# Patient Record
Sex: Female | Born: 1970
Health system: Southern US, Community
[De-identification: ages and names within clinical notes are randomized; demographics above are authoritative.]

## PROBLEM LIST (undated history)

## (undated) DIAGNOSIS — E7800 Pure hypercholesterolemia, unspecified: Secondary | ICD-10-CM

## (undated) DIAGNOSIS — M1990 Unspecified osteoarthritis, unspecified site: Secondary | ICD-10-CM

## (undated) DIAGNOSIS — I10 Essential (primary) hypertension: Secondary | ICD-10-CM

## (undated) HISTORY — PX: MENISCUS REPAIR: SHX5179

## (undated) HISTORY — PX: WISDOM TOOTH EXTRACTION: SHX21

## (undated) HISTORY — DX: Essential (primary) hypertension: I10

## (undated) HISTORY — PX: REFRACTIVE SURGERY: SHX103

## (undated) HISTORY — DX: Unspecified osteoarthritis, unspecified site: M19.90

## (undated) HISTORY — DX: Pure hypercholesterolemia, unspecified: E78.00

## (undated) HISTORY — PX: ENDOMETRIAL ABLATION: SHX621

---

## 1999-10-14 ENCOUNTER — Other Ambulatory Visit: Admission: RE | Admit: 1999-10-14 | Discharge: 1999-10-14 | Payer: Self-pay | Admitting: Obstetrics & Gynecology

## 2000-09-05 ENCOUNTER — Other Ambulatory Visit: Admission: RE | Admit: 2000-09-05 | Discharge: 2000-09-05 | Payer: Self-pay | Admitting: Obstetrics and Gynecology

## 2000-11-26 ENCOUNTER — Other Ambulatory Visit: Admission: RE | Admit: 2000-11-26 | Discharge: 2000-11-26 | Payer: Self-pay | Admitting: Obstetrics & Gynecology

## 2001-03-29 ENCOUNTER — Inpatient Hospital Stay (HOSPITAL_COMMUNITY): Admission: AD | Admit: 2001-03-29 | Discharge: 2001-03-29 | Payer: Self-pay | Admitting: Obstetrics and Gynecology

## 2001-03-31 ENCOUNTER — Inpatient Hospital Stay (HOSPITAL_COMMUNITY): Admission: AD | Admit: 2001-03-31 | Discharge: 2001-04-02 | Payer: Self-pay | Admitting: Obstetrics and Gynecology

## 2001-04-26 ENCOUNTER — Other Ambulatory Visit: Admission: RE | Admit: 2001-04-26 | Discharge: 2001-04-26 | Payer: Self-pay | Admitting: Obstetrics & Gynecology

## 2002-05-14 ENCOUNTER — Encounter (INDEPENDENT_AMBULATORY_CARE_PROVIDER_SITE_OTHER): Payer: Self-pay | Admitting: Specialist

## 2002-05-14 ENCOUNTER — Inpatient Hospital Stay (HOSPITAL_COMMUNITY): Admission: AD | Admit: 2002-05-14 | Discharge: 2002-05-17 | Payer: Self-pay | Admitting: Obstetrics & Gynecology

## 2002-06-13 ENCOUNTER — Other Ambulatory Visit: Admission: RE | Admit: 2002-06-13 | Discharge: 2002-06-13 | Payer: Self-pay | Admitting: Obstetrics & Gynecology

## 2003-11-08 ENCOUNTER — Emergency Department (HOSPITAL_COMMUNITY): Admission: EM | Admit: 2003-11-08 | Discharge: 2003-11-08 | Payer: Self-pay | Admitting: Emergency Medicine

## 2004-01-04 ENCOUNTER — Other Ambulatory Visit: Admission: RE | Admit: 2004-01-04 | Discharge: 2004-01-04 | Payer: Self-pay | Admitting: Obstetrics & Gynecology

## 2004-01-11 ENCOUNTER — Encounter: Admission: RE | Admit: 2004-01-11 | Discharge: 2004-01-11 | Payer: Self-pay | Admitting: Obstetrics & Gynecology

## 2005-03-10 ENCOUNTER — Other Ambulatory Visit: Admission: RE | Admit: 2005-03-10 | Discharge: 2005-03-10 | Payer: Self-pay | Admitting: Family Medicine

## 2007-08-01 ENCOUNTER — Emergency Department (HOSPITAL_COMMUNITY): Admission: EM | Admit: 2007-08-01 | Discharge: 2007-08-01 | Payer: Self-pay | Admitting: Emergency Medicine

## 2008-08-16 ENCOUNTER — Emergency Department (HOSPITAL_COMMUNITY): Admission: EM | Admit: 2008-08-16 | Discharge: 2008-08-16 | Payer: Self-pay | Admitting: Emergency Medicine

## 2010-04-27 ENCOUNTER — Other Ambulatory Visit: Admission: RE | Admit: 2010-04-27 | Discharge: 2010-04-27 | Payer: Self-pay | Admitting: Family Medicine

## 2011-02-03 NOTE — Discharge Summary (Signed)
NAMEPAYTYN, Mariah Irwin                          ACCOUNT NO.:  [id number]   MEDICAL RECORD NO.:  [id number]                   PATIENT TYPE:  INP   LOCATION:  9108                                 F[facility]ILITY:  [facility]   PHYSICIAN:  Genia Del, M.D.             DATE OF BIRTH:  [birth year]   DATE OF ADMISSION:  05/14/2002  DATE OF DISCHARGE:  05/17/2002                                 DISCHARGE SUMMARY   ADMISSION DIAGNOSES:  1. A 40 week and 1 day gestation with advanced cervical dilatation.  2. Suspicion of large for gestational age.   DISCHARGE DIAGNOSES:  1. A 40 week and 1 day gestation with advanced cervical dilatation.  2. Suspicion of large for gestational age.  3. Nonreassuring fetal heart rate monitoring.  4. Simple right ovarian cyst during pregnancy.  5. Birth of a baby girl 8 pounds and 4 ounces.   SURGERY:  Primary urgent low transverse cesarean section and right ovarian  cystectomy on May 14, 2002.   HOSPITAL COURSE:  The patient is a G2, P1 at 40 weeks and 1 day gestation  admitted for induction.  She was started on low dose Pitocin.  Artificial  rupture of membranes was done at 8:15 a.m.  The amniotic fluid was clear.  She progressed well up to 6 cm and then had protracted labor as well as a  fetal heart rate that tended to be bradycardic with a baseline in the 100-  105.  There were also episodes of further bradycardia down to 60-80  recovering with scalp stimulation.  She had an O2 mask and changed position  many times.  An amnioinfusion was attempted without much improvement.  There  were intermittent accelerations to 120-130.  Pitocin was discontinued during  periods of bradycardia and uterine contractions decreased to every three to  four minutes.  Her examination at that time around 1 p.m. was at 7 cm with a  left occiput posterior presentation at -1.  Because of her nonreassuring  fetal heart rate monitoring the decision was taken to proceed with stat  cesarean section.  A primary urgent low transverse cesarean section was  performed.  A right ovarian cystectomy was done which was a simple clear  cyst measuring about 6 cm.  Birth of a baby girl at 1325 weighing 8 pounds 4  ounces in posterior presentation.  Apgars were 8 and 9 by the neonatal team.  pH was 7.26.  The hysteroscopy was closed in two planes.  Estimated blood  loss was 700 cc.  No complication occurred.  The postoperative evolution was  unremarkable.  The patient remained afebrile and hemodynamically stable.  Her hemoglobin on postoperative day number one was 9 with a hematocrit at  26.8.  The patient was discharged in good status on postoperative day number  three.  She was prescribed Darvocet p.r.n.  Will follow up at the office in  about  four weeks.  Postoperative postpartum advice given.                                               Genia Del, M.D.    ML/MEDQ  D:  06/23/2002  T:  06/23/2002  Job:  [id number]

## 2011-02-03 NOTE — H&P (Signed)
NAME:  Mariah Irwin, Mariah Irwin                          ACCOUNT NO.:  [id number]   MEDICAL RECORD NO.:  [id number]                   PATIENT TYPE:  INP   LOCATION:  9167                                 [facility]ILITY:  [facility]   PHYSICIAN:  Genia Del, M.D.             DATE OF BIRTH:  22-Jan-1971   DATE OF ADMISSION:  05/14/2002  DATE OF DISCHARGE:                                HISTORY & PHYSICAL   HISTORY AND PHYSICAL:  The patient is a 40 year old woman, G2, P1, expected  date of delivery May 13, 2002, by ultrasound at 40 weeks and 1 day  gestation.   REASON FOR ADMISSION:  Induction for favorable cervix in a multiparous  patient.   HISTORY OF PRESENT ILLNESS:  Fetal movements positive.  No vaginal bleeding.  No fluid leak.  No regular uterine contractions.  No PIH symptoms.   PAST OBSTETRICAL HISTORY:  July 2002, 40 weeks, vacuum-assisted vaginal  delivery, baby boy, 7 pounds 8 ounces.  No complication.   PAST MEDICAL HISTORY:  Negative.   PAST SURGICAL HISTORY:  1. Extraction of wisdom teeth at age 40.  2. Root canal at age 19.  3. LASIK surgery, March 2001.   FAMILY HISTORY:  Noncontributory.   SOCIAL HISTORY:  Married, vegetarian, nonsmoker.   ALLERGIES:  No known drug allergies.   MEDICATIONS:  1. Prenatal vitamins.  2. Iron supplement.   HISTORY OF PRESENT PREGNANCY:  Mild spotting in the first trimester.  Labs  showed a hemoglobin of 12.9, platelets 343, blood type Rh O positive, Rh  antibodies negative, syphilis nonreactive,  HbsAg negative, rubella titer  immune, gonorrhea and Chlamydia negative.  In the second trimester, she had  a triple test which showed within normal limits.  Ultrasound at 19+ weeks  showed a review of anatomy within normal limits.  Placenta was posterior  normal.  Dating was achieved by ultrasound in the first trimester and was  compatible with 19+ weeks ultrasound.  She had stable right ovarian clear  cyst at 5.9 cm.  In the third trimester,  one hour GTT was normal at 116.  Hemoglobin was 11 at 27+ weeks.  At 32 weeks, the patient had mild preterm  cervical change which remained stable with relative bed rest.  Group B strep  was negative at 33+ weeks.  Blood pressures remained normal throughout  pregnancy.   REVIEW OF SYSTEMS:  CONSTITUTIONAL:  Negative.  HEENT:  Negative.  CARDIOVASCULAR/RESPIRATORY/GI/UROLOGY:  Negative.  DERMATOLOGY/ENDOCRINE/NEUROLOGIC:  Negative.   PHYSICAL EXAMINATION:  GENERAL:  No apparent distress on admission.  VITAL SIGNS:  Temperature 97.6, pulse 68, respiratory rate 20, blood  pressure 118/64.  LUNGS:  Clear bilaterally.  HEART:  Regular cardiac rhythm.  No murmur.  ABDOMEN:  Gravid.  Cephalad presentation.  VAGINAL EXAM ON ADMISSION:  3 cm dilated, 75-80% effaced, vertex, -1.  Membranes intact. Baseline was 150's.  LOWER LIMBS:  Normal.  MONITORING:  Fetal heart rate baseline 125-135.  Good long-term variability  with accelerations, reactive NST, no deceleration.  Uterine contractions  irregular on admission.   IMPRESSION:  1. G2, P1, 40 weeks and 1 day gestation with favorable cervix.  2. Fetal well being reassuring.  3. Group B strep negative.   PLAN:  1. Induce labor with low-dose Pitocin and artificial rupture of membranes.  2. Monitoring.  3. Expectant management towards probable vaginal delivery.                                               Genia Del, M.D.    ML/MEDQ  D:  05/14/2002  T:  05/14/2002  Job:  [id number]

## 2011-02-03 NOTE — H&P (Signed)
[facility]  Patient:    Mariah Irwin, Mariah Irwin                       MRN: [id number] Adm. Date:  [id number] Attending:  Silverio Lay A                         History and Physical  REASON FOR ADMISSION:         Intrauterine pregnancy at 39 weeks and 4 days with spontaneous rupture of membranes.  HISTORY OF PRESENT ILLNESS:   This is a 40 year old, married, Asian female, gravida 1, para 0, with a due date of April 03, 2001, who came in reporting continuous rupture of membranes at 2:45 a.m.  The fluid was meconium stained and she was reporting contractions every 5-10 minutes.  She reports good fetal activity.  Denies any bleeding and denies any pregnancy-induced hypertension.  PRENATAL COURSE:              Blood O+.  Sickle cell trait negative.  RPR nonreactive.  Rubella immune.  HBSAG negative.  HIV nonreactive.  Pap smear in December of 2001 with ascus.  Gonorrhea and chlamydia in December negative.  A 16-week AFP done with prior practice within normal limits.  A 24-week complete anatomy redo by ultrasound within normal limits.  A 28-week glucose tolerance test within normal limits.  A 35-week group B streptococcus positive.  The prenatal course was otherwise complicated by PUPPP at 35-36 weeks of pregnancy with continuous resolution.  ALLERGIES:                    No known drug allergies.  PAST MEDICAL HISTORY:         History of ascus since December of 2001.  First trimester spotting secondary to subchorionic hemorrhage.  The patient is a lactovegetarian.  FAMILY HISTORY:               Maternal uncle with diabetes.  SOCIAL HISTORY:               Married.  Nonsmoker.  She is a professor at [employer].  PHYSICAL EXAMINATION:         The vital signs are normal.  HEENT:                        Negative.  LUNGS:                        Clear.  HEART:                        Normal.  ABDOMEN:                      Gravid and nontender.  PELVIC:                        The vaginal exam by the admitting nurse showed 3 cm, 90% effaced, and vertex -1.  Light meconium-stained fluid.  Nitrazine positive.  Fern positive.  EXTREMITIES:                  Negative.  ASSESSMENT:                   1. Intrauterine pregnancy at 39 weeks and  4 days.                               2. Spontaneous rupture of membranes with light                                  meconium-stained fluid.                               3. Group B streptococcus positive.                               4. Early labor.  PLAN:                         The patient will be admitted to labor and delivery with routine peripartum orders and will be started on penicillin IV protocol for group B streptococcus positive.  If labor does not achieve an active pattern, will start Pitocin p.r.n. DD:  03/31/01 TD:  03/31/01 Job: [id number]

## 2011-02-03 NOTE — Op Note (Signed)
Mariah Irwin, Mariah Irwin                          ACCOUNT NO.:  [id number]   MEDICAL RECORD NO.:  [id number]                   PATIENT TYPE:  INP   LOCATION:  9108                                 [facility]ILITY[facility]   PHYSICIAN:  Genia Del, M.D.             DATE OF BIRTH:  1971/08/25   DATE OF PROCEDURE:  05/14/2002  DATE OF DISCHARGE:                                 OPERATIVE REPORT   PREOPERATIVE DIAGNOSES:  1. Forty plus weeks gestation.  2. Non-reassuring fetal heart rate monitoring.  3. Simple right ovarian cyst on ultrasound during pregnancy.   POSTOPERATIVE DIAGNOSES:  1. Forty plus weeks gestation.  2. Non-reassuring fetal heart rate monitoring.  3. Simple right ovarian cyst on ultrasound during pregnancy.   INTERVENTION:  Primary urgent low transverse cesarean section plus right  ovarian cystectomy.   SURGEON:  Genia Del, M.D.   ASSISTANT:  Sheronette A. Cherly Hensen, M.D.   ANESTHESIOLOGIST:  Quillian Quince, M.D.   PROCEDURE:  Under epidural, the patient is in a 15-degree left decubitus  position.  She is prepped with a spray of Betadine over the abdomen,  suprapubic, and vulvar areas.  The bladder catheter is already in place and  the patient is draped as usual.  A Pfannenstiel incision is made with a  scalpel.  The aponeurosis is opened transversally with the Mayo scissors  then separated from the recti muscles on the midline.  We opened the  parietal peritoneum bluntly and stretched on each side with the fingers.  We  then inserted the bladder retractor.  The visceral peritoneum is opened  transversely with the Metzenbaum scissors over the lower uterine segment.  The bladder is reclined downward.  We then opened the lower uterine segment  transversely with a scalpel.  Prolongation is made on each side with the  scissors.  The amniotic fluid is clear.  The baby is in left posterior  occiput presentation.  Birth of a baby girl at 35.  The baby is  suctioned  after delivery of the head.  The cord is then clamped and cut.  The baby is  given to the neonatal team.  Apgars are 8 and 9, pH is 7.26.  Cord blood is  also taken.  We evacuated the placenta spontaneously.  It is sent to  pathology.  Revision of the uterus is done.  We start Pitocin in the IV  fluids.  A dose of Cefotan 1 g IV is given.  The hysterotomy is closed in a  first total plane with a running-lock of 0 Vicryl.  A second plane is done  in a mattress fashion with 0 Vicryl.  That completes hemostasis.  We then  visualized the right ovarian cyst.  It appears like a thin-walled tier cyst.  A cystectomy is done by fine dissection with Metzenbaum scissors.  The wall  is sent to pathology.  Hemostasis is controlled at  the base of the right  ovary with a pursestring stitch with 3-0 Vicryl.  We completed the closure  of the right ovary with a mattress stitch to finish with 3-0 Vicryl.  Hemostasis is good at that level.  Both tubes are normal in size and  appearance.  The left ovary is normal in size and appearance.  The uterus is  contracting well and normal.  We then irrigated and suctioned the  abdominopelvic cavity.  Blood clots are removed.  We then verified  hemostasis at the hysterotomy and it was adequate.  We verified hemostasis  at the level of the bladder flap, the recti muscles, and the aponeurosis,  and it is completed as necessary with the electrocautery.  We reapproximated  the recti muscles in the midline to avoid a diastasis of the recti muscles.  We do that with 2-0 Vicryl.  Two blue sutures are put in place.  We then  closed the aponeurosis with two half-running sutures of 0 Vicryl and  verified hemostasis on the adipose tissue.  This completed with the  electrocautery.  The subcutaneous tissue infiltrated with Marcaine 0.25%  plain 10 cc and we approximated  the skin with staples.  The count of  sponges and instruments was complete x2.  The estimated blood  loss was 700  cc.  The patient's blood group is O-positive.  She was rubella immune.  A  dry dressing was put on the incision.  The patient was transferred to  recovery room in good status.  No complication occurred.                                                Genia Del, M.D.    ML/MEDQ  D:  05/14/2002  T:  05/14/2002  Job:  [id number]

## 2011-06-08 ENCOUNTER — Other Ambulatory Visit: Payer: Self-pay | Admitting: Physician Assistant

## 2011-06-08 ENCOUNTER — Other Ambulatory Visit (HOSPITAL_COMMUNITY)
Admission: RE | Admit: 2011-06-08 | Discharge: 2011-06-08 | Disposition: A | Discharge status: Home / Self Care | Payer: Self-pay | Source: Ambulatory Visit | Attending: Family Medicine | Admitting: Family Medicine

## 2011-06-08 DIAGNOSIS — Z124 Encounter for screening for malignant neoplasm of cervix: Secondary | ICD-10-CM | POA: Insufficient documentation

## 2012-08-08 ENCOUNTER — Other Ambulatory Visit (HOSPITAL_COMMUNITY)
Admission: RE | Admit: 2012-08-08 | Discharge: 2012-08-08 | Disposition: A | Discharge status: Home / Self Care | Payer: 59 | Source: Ambulatory Visit | Attending: Family Medicine | Admitting: Family Medicine

## 2012-08-08 ENCOUNTER — Other Ambulatory Visit: Payer: Self-pay | Admitting: Physician Assistant

## 2012-08-08 DIAGNOSIS — Z124 Encounter for screening for malignant neoplasm of cervix: Secondary | ICD-10-CM | POA: Insufficient documentation

## 2012-10-03 ENCOUNTER — Other Ambulatory Visit (HOSPITAL_COMMUNITY): Payer: Self-pay | Admitting: Physician Assistant

## 2012-10-03 ENCOUNTER — Other Ambulatory Visit: Payer: Self-pay | Admitting: Family Medicine

## 2012-10-03 DIAGNOSIS — Z1231 Encounter for screening mammogram for malignant neoplasm of breast: Secondary | ICD-10-CM

## 2012-11-01 ENCOUNTER — Ambulatory Visit: Payer: 59

## 2012-11-29 ENCOUNTER — Ambulatory Visit: Payer: 59

## 2012-12-23 ENCOUNTER — Other Ambulatory Visit: Payer: Self-pay | Admitting: Internal Medicine

## 2012-12-23 DIAGNOSIS — I10 Essential (primary) hypertension: Secondary | ICD-10-CM

## 2012-12-27 ENCOUNTER — Ambulatory Visit
Admission: RE | Admit: 2012-12-27 | Discharge: 2012-12-27 | Disposition: A | Discharge status: Home / Self Care | Payer: 59 | Source: Ambulatory Visit | Attending: Family Medicine | Admitting: Family Medicine

## 2012-12-27 DIAGNOSIS — Z1231 Encounter for screening mammogram for malignant neoplasm of breast: Secondary | ICD-10-CM

## 2012-12-30 ENCOUNTER — Ambulatory Visit
Admission: RE | Admit: 2012-12-30 | Discharge: 2012-12-30 | Disposition: A | Discharge status: Home / Self Care | Payer: 59 | Source: Ambulatory Visit | Attending: Internal Medicine | Admitting: Internal Medicine

## 2012-12-30 DIAGNOSIS — I10 Essential (primary) hypertension: Secondary | ICD-10-CM

## 2013-07-11 ENCOUNTER — Other Ambulatory Visit: Payer: Self-pay | Admitting: Obstetrics and Gynecology

## 2013-07-15 ENCOUNTER — Other Ambulatory Visit: Payer: Self-pay | Admitting: General Surgery

## 2013-07-15 MED ORDER — SIMVASTATIN 10 MG PO TABS: 10.0000 mg | ORAL_TABLET | Freq: Every day | ORAL | Status: DC

## 2013-07-18 ENCOUNTER — Telehealth: Payer: Self-pay

## 2013-07-18 NOTE — Telephone Encounter (Signed)
refill 

## 2013-08-08 ENCOUNTER — Telehealth: Payer: Self-pay | Admitting: Cardiology

## 2013-08-08 NOTE — Telephone Encounter (Signed)
To Dr Turner to advise 

## 2013-08-08 NOTE — Telephone Encounter (Signed)
That needs to be filled out by her PCP

## 2013-08-08 NOTE — Telephone Encounter (Signed)
Pt is aware.  

## 2013-08-08 NOTE — Telephone Encounter (Signed)
New Problem:   Pt is asking if all her tests/appts this year would be the equivalent of a physical. Pt is asking if  Dr. Mayford Knife can complete a form for her insurance. A Routine Preventative Care Form. Please call pt back

## 2014-05-15 ENCOUNTER — Encounter: Payer: Self-pay | Admitting: General Surgery

## 2014-05-15 DIAGNOSIS — I10 Essential (primary) hypertension: Secondary | ICD-10-CM

## 2014-06-04 ENCOUNTER — Encounter: Payer: Self-pay | Admitting: Cardiology

## 2014-06-04 ENCOUNTER — Ambulatory Visit (INDEPENDENT_AMBULATORY_CARE_PROVIDER_SITE_OTHER): Payer: 59 | Admitting: Cardiology

## 2014-06-04 VITALS — BP 122/78 | HR 58 | Ht 65.5 in | Wt 155.8 lb

## 2014-06-04 DIAGNOSIS — E785 Hyperlipidemia, unspecified: Secondary | ICD-10-CM | POA: Insufficient documentation

## 2014-06-04 DIAGNOSIS — I10 Essential (primary) hypertension: Secondary | ICD-10-CM

## 2014-06-04 MED ORDER — SIMVASTATIN 10 MG PO TABS: 10.0000 mg | ORAL_TABLET | Freq: Every day | ORAL | Status: DC

## 2014-06-04 MED ORDER — HYDROCHLOROTHIAZIDE 25 MG PO TABS: 25.0000 mg | ORAL_TABLET | Freq: Every day | ORAL | Status: DC

## 2014-06-04 NOTE — Progress Notes (Signed)
  [place], [address] [place], [place]  [address] Phone: [phone] Fax:  [phone]  Date:  06/04/2014   ID:  Doran Heater, DOB [birth year], MRN [id number]  PCP:  REDMON,NOELLE, PA-C  Cardiologist:  Sherre Scarlet   History of Present Illness: Mariah Irwin is a 43 y.o. female with a history of HTN and dyslipidemia  who presents today for followup.  She is doing well.  She denies any chest pain, SOB, DOE, LE edema, dizziness, palpitations or syncope.  Wt Readings from Last 3 Encounters:  06/04/14 155 lb 12.8 oz (70.67 kg)     Past Medical History  Diagnosis Date  . Hypertension   . Hypercholesteremia     Current Outpatient Prescriptions  Medication Sig Dispense Refill  . hydrochlorothiazide (HYDRODIURIL) 25 MG tablet Take 25 mg by mouth daily.      . Lactobacillus (ACIDOPHILUS PO) Take 1 tablet by mouth daily.      . Magnesium 250 MG TABS Take 1 tablet by mouth daily.      . Multiple Vitamin (MULTIVITAMIN) tablet Take 1 tablet by mouth daily.      . Omega-3 Fatty Acids (FISH OIL) 1200 MG CAPS Take 2 capsules by mouth daily.      . simvastatin (ZOCOR) 10 MG tablet Take 1 tablet (10 mg total) by mouth at bedtime.  90 tablet  3  . vitamin E 400 UNIT capsule Take 400 Units by mouth daily.       No current facility-administered medications for this visit.    Allergies:    Allergies  Allergen Reactions  . Lisinopril Other (See Comments)    Dizziness/Stomach ache    Social History:  The patient  reports that she has never smoked. She does not have any smokeless tobacco history on file. She reports that she drinks alcohol. She reports that she does not use illicit drugs.   Family History:  The patient's family history includes High Cholesterol in her father.   ROS:  Please see the history of present illness.      All other systems reviewed and negative.   PHYSICAL EXAM: VS:  BP 122/78  Pulse 58  Ht 5' 5.5" (1.664 m)  Wt 155 lb 12.8 oz (70.67 kg)  BMI 25.52  kg/m2 Well nourished, well developed, in no acute distress HEENT: normal Neck: no JVD Cardiac:  normal S1, S2; RRR; no murmur Lungs:  clear to auscultation bilaterally, no wheezing, rhonchi or rales Abd: soft, nontender, no hepatomegaly Ext: no edema Skin: warm and dry Neuro:  CNs 2-12 intact, no focal abnormalities noted  EKG:    Sinus bradycardia with no ST changes  ASSESSMENT AND PLAN:  1. HTN well controlled - continue HCTZ - check BMET 2.  Dyslipidemia - check FLP and ALT  Followup with me in 1 year  Signed, Fransico Him, MD 06/04/2014 3:07 PM

## 2014-06-04 NOTE — Patient Instructions (Signed)
Your physician recommends that you continue on your current medications as directed. Please refer to the Current Medication list given to you today.  Your physician recommends that you return for lab work in: the  Next week for LIPID, ALT, and BMET Panels. These will be FASTING. Please schedule at check out prior to leaving the office today.   Your physician wants you to follow-up in: 1 year with Dr Mallie Snooks will receive a reminder letter in the mail two months in advance. If you don't receive a letter, please call our office to schedule the follow-up appointment.

## 2014-06-09 ENCOUNTER — Other Ambulatory Visit: Payer: 59

## 2014-06-17 ENCOUNTER — Other Ambulatory Visit (INDEPENDENT_AMBULATORY_CARE_PROVIDER_SITE_OTHER): Payer: 59

## 2014-06-17 DIAGNOSIS — E785 Hyperlipidemia, unspecified: Secondary | ICD-10-CM

## 2014-06-17 DIAGNOSIS — I10 Essential (primary) hypertension: Secondary | ICD-10-CM

## 2014-06-17 LAB — LIPID PANEL
Cholesterol: 180 mg/dL (ref 0–200)
HDL: 53.7 mg/dL (ref >39.00)
LDL Cholesterol: 104 mg/dL — ABNORMAL HIGH (ref 0–99)
NonHDL: 126.3
TRIGLYCERIDES: 112 mg/dL (ref 0.0–149.0)
Total CHOL/HDL Ratio: 3
VLDL: 22.4 mg/dL (ref 0.0–40.0)

## 2014-06-17 LAB — BASIC METABOLIC PANEL
BUN: 12 mg/dL (ref 6–23)
CALCIUM: 8.9 mg/dL (ref 8.4–10.5)
CO2: 26 meq/L (ref 19–32)
CREATININE: 0.8 mg/dL (ref 0.4–1.2)
Chloride: 103 mEq/L (ref 96–112)
GFR: 85.4 mL/min (ref >60.00)
GLUCOSE: 93 mg/dL (ref 70–99)
POTASSIUM: 3.4 meq/L — AB (ref 3.5–5.1)
Sodium: 138 mEq/L (ref 135–145)

## 2014-06-17 LAB — ALT: ALT: 20 U/L (ref 0–35)

## 2014-06-18 ENCOUNTER — Other Ambulatory Visit: Payer: Self-pay | Admitting: General Surgery

## 2014-06-18 DIAGNOSIS — Z79899 Other long term (current) drug therapy: Secondary | ICD-10-CM

## 2014-06-18 MED ORDER — POTASSIUM CHLORIDE CRYS ER 20 MEQ PO TBCR: 20.0000 meq | EXTENDED_RELEASE_TABLET | Freq: Every day | ORAL | Status: DC

## 2014-06-25 ENCOUNTER — Other Ambulatory Visit: Payer: 59

## 2014-06-26 ENCOUNTER — Other Ambulatory Visit (INDEPENDENT_AMBULATORY_CARE_PROVIDER_SITE_OTHER): Payer: 59 | Admitting: *Deleted

## 2014-06-26 DIAGNOSIS — Z79899 Other long term (current) drug therapy: Secondary | ICD-10-CM

## 2014-06-26 LAB — BASIC METABOLIC PANEL
BUN: 21 mg/dL (ref 6–23)
CALCIUM: 9.2 mg/dL (ref 8.4–10.5)
CO2: 26 meq/L (ref 19–32)
CREATININE: 0.8 mg/dL (ref 0.4–1.2)
Chloride: 104 mEq/L (ref 96–112)
GFR: 79.48 mL/min (ref >60.00)
Glucose, Bld: 90 mg/dL (ref 70–99)
Potassium: 3.5 mEq/L (ref 3.5–5.1)
SODIUM: 135 meq/L (ref 135–145)

## 2014-07-01 ENCOUNTER — Other Ambulatory Visit: Payer: Self-pay | Admitting: General Surgery

## 2014-07-01 DIAGNOSIS — Z79899 Other long term (current) drug therapy: Secondary | ICD-10-CM

## 2014-07-01 MED ORDER — POTASSIUM CHLORIDE CRYS ER 20 MEQ PO TBCR: 20.0000 meq | EXTENDED_RELEASE_TABLET | Freq: Two times a day (BID) | ORAL | Status: DC

## 2014-07-08 ENCOUNTER — Other Ambulatory Visit (INDEPENDENT_AMBULATORY_CARE_PROVIDER_SITE_OTHER): Payer: 59 | Admitting: *Deleted

## 2014-07-08 DIAGNOSIS — Z79899 Other long term (current) drug therapy: Secondary | ICD-10-CM

## 2014-07-08 LAB — BASIC METABOLIC PANEL
BUN: 16 mg/dL (ref 6–23)
CALCIUM: 9.7 mg/dL (ref 8.4–10.5)
CO2: 24 mEq/L (ref 19–32)
Chloride: 104 mEq/L (ref 96–112)
Creatinine, Ser: 1 mg/dL (ref 0.4–1.2)
GFR: 62.64 mL/min (ref >60.00)
Glucose, Bld: 87 mg/dL (ref 70–99)
Potassium: 3.9 mEq/L (ref 3.5–5.1)
Sodium: 138 mEq/L (ref 135–145)

## 2015-03-24 ENCOUNTER — Encounter: Payer: Self-pay | Admitting: Cardiology

## 2015-05-20 ENCOUNTER — Encounter: Payer: Self-pay | Admitting: Cardiology

## 2015-06-21 ENCOUNTER — Other Ambulatory Visit (INDEPENDENT_AMBULATORY_CARE_PROVIDER_SITE_OTHER): Payer: 59 | Admitting: *Deleted

## 2015-06-21 DIAGNOSIS — E785 Hyperlipidemia, unspecified: Secondary | ICD-10-CM | POA: Diagnosis not present

## 2015-06-21 DIAGNOSIS — I10 Essential (primary) hypertension: Secondary | ICD-10-CM | POA: Diagnosis not present

## 2015-06-21 LAB — LIPID PANEL
Cholesterol: 133 mg/dL (ref 0–200)
HDL: 42.5 mg/dL
LDL Cholesterol: 62 mg/dL (ref 0–99)
NonHDL: 90.36
Total CHOL/HDL Ratio: 3
Triglycerides: 143 mg/dL (ref 0.0–149.0)
VLDL: 28.6 mg/dL (ref 0.0–40.0)

## 2015-06-21 LAB — ALT: ALT: 21 U/L (ref 0–35)

## 2015-06-21 NOTE — Addendum Note (Signed)
Addended by: Eulis Foster on: 06/21/2015 08:07 AM   Modules accepted: Orders

## 2015-06-23 ENCOUNTER — Ambulatory Visit: Payer: Self-pay | Admitting: Cardiology

## 2015-06-30 ENCOUNTER — Ambulatory Visit (INDEPENDENT_AMBULATORY_CARE_PROVIDER_SITE_OTHER): Payer: 59 | Admitting: Cardiology

## 2015-06-30 ENCOUNTER — Encounter: Payer: Self-pay | Admitting: Cardiology

## 2015-06-30 VITALS — BP 126/60 | HR 63 | Ht 65.5 in | Wt 149.0 lb

## 2015-06-30 DIAGNOSIS — I10 Essential (primary) hypertension: Secondary | ICD-10-CM | POA: Diagnosis not present

## 2015-06-30 DIAGNOSIS — E785 Hyperlipidemia, unspecified: Secondary | ICD-10-CM | POA: Diagnosis not present

## 2015-06-30 NOTE — Patient Instructions (Signed)
Medication Instructions:  Your physician recommends that you continue on your current medications as directed. Please refer to the Current Medication list given to you today.   Labwork: Your physician recommends that you return for FASTING lab work in: 3 months    Testing/Procedures: None  Follow-Up: Your physician wants you to follow-up in: 1 year with Dr. Radford Pax. You will receive a reminder letter in the mail two months in advance. If you don't receive a letter, please call our office to schedule the follow-up appointment.   Any Other Special Instructions Will Be Listed Below (If Applicable).

## 2015-06-30 NOTE — Progress Notes (Signed)
Cardiology Office Note   Date:  06/30/2015   ID:  HAMDI VARI, DOB September 26, 1970, MRN [id number]  PCP:  Rachell Cipro, MD    No chief complaint on file.     History of Present Illness: Mariah Irwin is a 44 y.o. female with a history of HTN and dyslipidemia who presents today for followup. She is doing well. She denies any chest pain, SOB, DOE, LE edema, dizziness, palpitations or syncope    Past Medical History  Diagnosis Date  . Hypertension   . Hypercholesteremia     History reviewed. No pertinent past surgical history.   Current Outpatient Prescriptions  Medication Sig Dispense Refill  . hydrochlorothiazide (HYDRODIURIL) 25 MG tablet Take 1 tablet (25 mg total) by mouth daily. 90 tablet 3  . Lactobacillus (ACIDOPHILUS PO) Take 1 tablet by mouth daily.    . Magnesium 250 MG TABS Take 1 tablet by mouth daily.    . Multiple Vitamin (MULTIVITAMIN) tablet Take 1 tablet by mouth daily.    . Omega-3 Fatty Acids (FISH OIL) 1200 MG CAPS Take 2 capsules by mouth daily.    . potassium chloride SA (K-DUR,KLOR-CON) 20 MEQ tablet Take 1 tablet (20 mEq total) by mouth 2 (two) times daily. 180 tablet 3  . simvastatin (ZOCOR) 10 MG tablet Take 1 tablet (10 mg total) by mouth at bedtime. 90 tablet 3  . vitamin E 400 UNIT capsule Take 400 Units by mouth daily.     No current facility-administered medications for this visit.    Allergies:   Lisinopril    Social History:  The patient  reports that she has never smoked. She does not have any smokeless tobacco history on file. She reports that she drinks alcohol. She reports that she does not use illicit drugs.   Family History:  The patient's family history includes High Cholesterol in her father.    ROS:  Please see the history of present illness.   Otherwise, review of systems are positive for none.   All other systems are reviewed and negative.    PHYSICAL EXAM: VS:  BP 126/60 mmHg  Pulse 63  Ht 5'  5.5" (1.664 m)  Wt 149 lb (67.586 kg)  BMI 24.41 kg/m2 , BMI Body mass index is 24.41 kg/(m^2). GEN: Well nourished, well developed, in no acute distress HEENT: normal Neck: no JVD, carotid bruits, or masses Cardiac: RRR; no murmurs, rubs, or gallops,no edema  Respiratory:  clear to auscultation bilaterally, normal work of breathing GI: soft, nontender, nondistended, + BS MS: no deformity or atrophy Skin: warm and dry, no rash Neuro:  Strength and sensation are intact Psych: euthymic mood, full affect   EKG:  EKG is ordered today. The ekg ordered today demonstrates NSR at 63bpm   Recent Labs: 07/08/2014: BUN 16; Creatinine, Ser 1.0; Potassium 3.9; Sodium 138 06/21/2015: ALT 21    Lipid Panel    Component Value Date/Time   CHOL 133 06/21/2015 0807   TRIG 143.0 06/21/2015 0807   HDL 42.50 06/21/2015 0807   CHOLHDL 3 06/21/2015 0807   VLDL 28.6 06/21/2015 0807   LDLCALC 62 06/21/2015 0807      Wt Readings from Last 3 Encounters:  06/30/15 149 lb (67.586 kg)  06/04/14 155 lb 12.8 oz (70.67 kg)    ASSESSMENT AND PLAN:  1. HTN well controlled - continue HCTZ - I will get a copy of BMET  from PCP 2. Dyslipidemia - LDL at goal on Simvastatin.  She has changed her diet and LDL much lower.  She would like to consider getting off statins.  I have recommended that we recheck an NMR panel in 3 months and if LDL is ok then stop statin and recheck again in 3 months.     Current medicines are reviewed at length with the patient today.  The patient does not have concerns regarding medicines.  The following changes have been made:  no change  Labs/ tests ordered today: See above Assessment and Plan Orders Placed This Encounter  Procedures  . EKG 12-Lead     Disposition:   FU with me in 1 year  Signed, Sueanne Margarita, MD  06/30/2015 8:18 AM    [facility] [address], [place], [place]  [address] Phone: [phone]; Fax: [phone]

## 2015-07-08 ENCOUNTER — Other Ambulatory Visit: Payer: Self-pay | Admitting: Cardiology

## 2015-07-30 ENCOUNTER — Other Ambulatory Visit: Payer: Self-pay | Admitting: Family Medicine

## 2015-07-30 DIAGNOSIS — Z1231 Encounter for screening mammogram for malignant neoplasm of breast: Secondary | ICD-10-CM

## 2015-08-19 ENCOUNTER — Ambulatory Visit: Payer: 59

## 2015-08-31 ENCOUNTER — Ambulatory Visit
Admission: RE | Admit: 2015-08-31 | Discharge: 2015-08-31 | Disposition: A | Discharge status: Home / Self Care | Payer: 59 | Source: Ambulatory Visit | Attending: Family Medicine | Admitting: Family Medicine

## 2015-08-31 DIAGNOSIS — Z1231 Encounter for screening mammogram for malignant neoplasm of breast: Secondary | ICD-10-CM

## 2015-09-27 ENCOUNTER — Other Ambulatory Visit: Payer: 59

## 2015-10-06 ENCOUNTER — Other Ambulatory Visit (INDEPENDENT_AMBULATORY_CARE_PROVIDER_SITE_OTHER): Payer: BLUE CROSS/BLUE SHIELD | Admitting: *Deleted

## 2015-10-06 DIAGNOSIS — E785 Hyperlipidemia, unspecified: Secondary | ICD-10-CM

## 2015-10-09 LAB — CARDIO IQ(R) ADVANCED LIPID PANEL
APOLIPOPROTEIN (CARDIO IQ ADV LIPID PANEL): 83 mg/dL (ref 49–103)
CHOLESTEROL, TOTAL (CARDIO IQ ADV LIPID PANEL): 168 mg/dL (ref 125–200)
Cholesterol/HDL Ratio: 3 calc (ref <=5.0)
HDL Cholesterol: 56 mg/dL (ref >=46)
LDL LARGE: 6316 nmol/L (ref 5038–17886)
LDL MEDIUM: 244 nmol/L (ref 121–397)
LDL PARTICLE NUMBER: 1268 nmol/L (ref 1016–2185)
LDL PEAK SIZE: 215.6 Angstrom — AB (ref >=218.2)
LDL SMALL: 261 nmol/L (ref 115–386)
LDL, Calculated: 89 mg/dL
Lipoprotein (a): 51 nmol/L (ref <75)
NON-HDL CHOLESTEROL (CARDIO IQ ADV LIPID PANEL): 112 mg/dL
TRIGLYCERIDES (CARDIO IQ ADV LIPID PANEL): 113 mg/dL

## 2015-11-29 ENCOUNTER — Other Ambulatory Visit: Payer: Self-pay | Admitting: *Deleted

## 2015-11-29 MED ORDER — SIMVASTATIN 10 MG PO TABS: ORAL_TABLET | ORAL | Status: DC

## 2015-11-29 NOTE — Telephone Encounter (Signed)
Patient requested a thirty day supply while she works with her insurance in reference to the mail order pharmacy that they use.

## 2016-06-28 DIAGNOSIS — Z23 Encounter for immunization: Secondary | ICD-10-CM | POA: Diagnosis not present

## 2016-08-03 DIAGNOSIS — Z0000 Encounter for general adult medical examination without abnormal findings: Secondary | ICD-10-CM | POA: Diagnosis not present

## 2016-08-04 DIAGNOSIS — Z23 Encounter for immunization: Secondary | ICD-10-CM | POA: Diagnosis not present

## 2016-08-04 DIAGNOSIS — Z0000 Encounter for general adult medical examination without abnormal findings: Secondary | ICD-10-CM | POA: Diagnosis not present

## 2016-10-06 DIAGNOSIS — Z23 Encounter for immunization: Secondary | ICD-10-CM | POA: Diagnosis not present

## 2016-11-30 DIAGNOSIS — F419 Anxiety disorder, unspecified: Secondary | ICD-10-CM | POA: Diagnosis not present

## 2016-12-21 DIAGNOSIS — F419 Anxiety disorder, unspecified: Secondary | ICD-10-CM | POA: Diagnosis not present

## 2017-01-19 DIAGNOSIS — F419 Anxiety disorder, unspecified: Secondary | ICD-10-CM | POA: Diagnosis not present

## 2017-02-02 DIAGNOSIS — E876 Hypokalemia: Secondary | ICD-10-CM | POA: Diagnosis not present

## 2017-02-02 DIAGNOSIS — Z6827 Body mass index (BMI) 27.0-27.9, adult: Secondary | ICD-10-CM | POA: Diagnosis not present

## 2017-02-02 DIAGNOSIS — I10 Essential (primary) hypertension: Secondary | ICD-10-CM | POA: Diagnosis not present

## 2017-02-02 DIAGNOSIS — E782 Mixed hyperlipidemia: Secondary | ICD-10-CM | POA: Diagnosis not present

## 2017-02-02 DIAGNOSIS — Z23 Encounter for immunization: Secondary | ICD-10-CM | POA: Diagnosis not present

## 2017-03-27 DIAGNOSIS — M9903 Segmental and somatic dysfunction of lumbar region: Secondary | ICD-10-CM | POA: Diagnosis not present

## 2017-03-27 DIAGNOSIS — M79651 Pain in right thigh: Secondary | ICD-10-CM | POA: Diagnosis not present

## 2017-03-27 DIAGNOSIS — M25551 Pain in right hip: Secondary | ICD-10-CM | POA: Diagnosis not present

## 2017-03-27 DIAGNOSIS — M25552 Pain in left hip: Secondary | ICD-10-CM | POA: Diagnosis not present

## 2017-03-29 DIAGNOSIS — M25551 Pain in right hip: Secondary | ICD-10-CM | POA: Diagnosis not present

## 2017-03-29 DIAGNOSIS — M25552 Pain in left hip: Secondary | ICD-10-CM | POA: Diagnosis not present

## 2017-03-29 DIAGNOSIS — M79651 Pain in right thigh: Secondary | ICD-10-CM | POA: Diagnosis not present

## 2017-03-29 DIAGNOSIS — M9903 Segmental and somatic dysfunction of lumbar region: Secondary | ICD-10-CM | POA: Diagnosis not present

## 2017-04-03 DIAGNOSIS — M79651 Pain in right thigh: Secondary | ICD-10-CM | POA: Diagnosis not present

## 2017-04-03 DIAGNOSIS — M25552 Pain in left hip: Secondary | ICD-10-CM | POA: Diagnosis not present

## 2017-04-03 DIAGNOSIS — M9903 Segmental and somatic dysfunction of lumbar region: Secondary | ICD-10-CM | POA: Diagnosis not present

## 2017-04-03 DIAGNOSIS — M25551 Pain in right hip: Secondary | ICD-10-CM | POA: Diagnosis not present

## 2017-04-05 DIAGNOSIS — M9903 Segmental and somatic dysfunction of lumbar region: Secondary | ICD-10-CM | POA: Diagnosis not present

## 2017-04-05 DIAGNOSIS — M25552 Pain in left hip: Secondary | ICD-10-CM | POA: Diagnosis not present

## 2017-04-05 DIAGNOSIS — M25551 Pain in right hip: Secondary | ICD-10-CM | POA: Diagnosis not present

## 2017-04-05 DIAGNOSIS — M79651 Pain in right thigh: Secondary | ICD-10-CM | POA: Diagnosis not present

## 2017-04-17 DIAGNOSIS — M25552 Pain in left hip: Secondary | ICD-10-CM | POA: Diagnosis not present

## 2017-04-17 DIAGNOSIS — M25551 Pain in right hip: Secondary | ICD-10-CM | POA: Diagnosis not present

## 2017-04-17 DIAGNOSIS — M9903 Segmental and somatic dysfunction of lumbar region: Secondary | ICD-10-CM | POA: Diagnosis not present

## 2017-04-17 DIAGNOSIS — M79651 Pain in right thigh: Secondary | ICD-10-CM | POA: Diagnosis not present

## 2017-04-17 DIAGNOSIS — M25521 Pain in right elbow: Secondary | ICD-10-CM | POA: Diagnosis not present

## 2017-04-19 DIAGNOSIS — M79651 Pain in right thigh: Secondary | ICD-10-CM | POA: Diagnosis not present

## 2017-04-19 DIAGNOSIS — M9903 Segmental and somatic dysfunction of lumbar region: Secondary | ICD-10-CM | POA: Diagnosis not present

## 2017-04-19 DIAGNOSIS — M25552 Pain in left hip: Secondary | ICD-10-CM | POA: Diagnosis not present

## 2017-04-19 DIAGNOSIS — M25551 Pain in right hip: Secondary | ICD-10-CM | POA: Diagnosis not present

## 2017-04-24 DIAGNOSIS — M25552 Pain in left hip: Secondary | ICD-10-CM | POA: Diagnosis not present

## 2017-04-24 DIAGNOSIS — M79651 Pain in right thigh: Secondary | ICD-10-CM | POA: Diagnosis not present

## 2017-04-24 DIAGNOSIS — M9903 Segmental and somatic dysfunction of lumbar region: Secondary | ICD-10-CM | POA: Diagnosis not present

## 2017-04-24 DIAGNOSIS — M25551 Pain in right hip: Secondary | ICD-10-CM | POA: Diagnosis not present

## 2017-04-26 DIAGNOSIS — M79651 Pain in right thigh: Secondary | ICD-10-CM | POA: Diagnosis not present

## 2017-04-26 DIAGNOSIS — M25551 Pain in right hip: Secondary | ICD-10-CM | POA: Diagnosis not present

## 2017-04-26 DIAGNOSIS — M9903 Segmental and somatic dysfunction of lumbar region: Secondary | ICD-10-CM | POA: Diagnosis not present

## 2017-04-26 DIAGNOSIS — M25552 Pain in left hip: Secondary | ICD-10-CM | POA: Diagnosis not present

## 2017-04-30 DIAGNOSIS — M5136 Other intervertebral disc degeneration, lumbar region: Secondary | ICD-10-CM | POA: Diagnosis not present

## 2017-04-30 DIAGNOSIS — M545 Low back pain: Secondary | ICD-10-CM | POA: Diagnosis not present

## 2017-04-30 DIAGNOSIS — M25521 Pain in right elbow: Secondary | ICD-10-CM | POA: Diagnosis not present

## 2017-04-30 DIAGNOSIS — G8929 Other chronic pain: Secondary | ICD-10-CM | POA: Diagnosis not present

## 2017-05-01 DIAGNOSIS — M79651 Pain in right thigh: Secondary | ICD-10-CM | POA: Diagnosis not present

## 2017-05-01 DIAGNOSIS — M25552 Pain in left hip: Secondary | ICD-10-CM | POA: Diagnosis not present

## 2017-05-01 DIAGNOSIS — M9903 Segmental and somatic dysfunction of lumbar region: Secondary | ICD-10-CM | POA: Diagnosis not present

## 2017-05-01 DIAGNOSIS — M25551 Pain in right hip: Secondary | ICD-10-CM | POA: Diagnosis not present

## 2017-05-08 DIAGNOSIS — M25551 Pain in right hip: Secondary | ICD-10-CM | POA: Diagnosis not present

## 2017-05-08 DIAGNOSIS — M79651 Pain in right thigh: Secondary | ICD-10-CM | POA: Diagnosis not present

## 2017-05-08 DIAGNOSIS — M25552 Pain in left hip: Secondary | ICD-10-CM | POA: Diagnosis not present

## 2017-05-08 DIAGNOSIS — M9903 Segmental and somatic dysfunction of lumbar region: Secondary | ICD-10-CM | POA: Diagnosis not present

## 2017-05-14 DIAGNOSIS — M25512 Pain in left shoulder: Secondary | ICD-10-CM | POA: Diagnosis not present

## 2017-05-15 DIAGNOSIS — M25551 Pain in right hip: Secondary | ICD-10-CM | POA: Diagnosis not present

## 2017-05-15 DIAGNOSIS — M9903 Segmental and somatic dysfunction of lumbar region: Secondary | ICD-10-CM | POA: Diagnosis not present

## 2017-05-15 DIAGNOSIS — M25552 Pain in left hip: Secondary | ICD-10-CM | POA: Diagnosis not present

## 2017-05-15 DIAGNOSIS — M79651 Pain in right thigh: Secondary | ICD-10-CM | POA: Diagnosis not present

## 2017-05-16 DIAGNOSIS — G8929 Other chronic pain: Secondary | ICD-10-CM | POA: Diagnosis not present

## 2017-05-16 DIAGNOSIS — M545 Low back pain: Secondary | ICD-10-CM | POA: Diagnosis not present

## 2017-05-22 DIAGNOSIS — M25551 Pain in right hip: Secondary | ICD-10-CM | POA: Diagnosis not present

## 2017-05-22 DIAGNOSIS — M79651 Pain in right thigh: Secondary | ICD-10-CM | POA: Diagnosis not present

## 2017-05-22 DIAGNOSIS — M25552 Pain in left hip: Secondary | ICD-10-CM | POA: Diagnosis not present

## 2017-05-22 DIAGNOSIS — M9903 Segmental and somatic dysfunction of lumbar region: Secondary | ICD-10-CM | POA: Diagnosis not present

## 2017-05-24 DIAGNOSIS — M25552 Pain in left hip: Secondary | ICD-10-CM | POA: Diagnosis not present

## 2017-05-24 DIAGNOSIS — M79651 Pain in right thigh: Secondary | ICD-10-CM | POA: Diagnosis not present

## 2017-05-24 DIAGNOSIS — M9903 Segmental and somatic dysfunction of lumbar region: Secondary | ICD-10-CM | POA: Diagnosis not present

## 2017-05-24 DIAGNOSIS — M25551 Pain in right hip: Secondary | ICD-10-CM | POA: Diagnosis not present

## 2017-05-25 DIAGNOSIS — M25552 Pain in left hip: Secondary | ICD-10-CM | POA: Diagnosis not present

## 2017-05-25 DIAGNOSIS — M79651 Pain in right thigh: Secondary | ICD-10-CM | POA: Diagnosis not present

## 2017-05-25 DIAGNOSIS — M9903 Segmental and somatic dysfunction of lumbar region: Secondary | ICD-10-CM | POA: Diagnosis not present

## 2017-05-25 DIAGNOSIS — M25551 Pain in right hip: Secondary | ICD-10-CM | POA: Diagnosis not present

## 2017-05-29 DIAGNOSIS — M9903 Segmental and somatic dysfunction of lumbar region: Secondary | ICD-10-CM | POA: Diagnosis not present

## 2017-05-29 DIAGNOSIS — M25552 Pain in left hip: Secondary | ICD-10-CM | POA: Diagnosis not present

## 2017-05-29 DIAGNOSIS — M79651 Pain in right thigh: Secondary | ICD-10-CM | POA: Diagnosis not present

## 2017-05-29 DIAGNOSIS — M25551 Pain in right hip: Secondary | ICD-10-CM | POA: Diagnosis not present

## 2017-05-30 DIAGNOSIS — M545 Low back pain: Secondary | ICD-10-CM | POA: Diagnosis not present

## 2017-05-30 DIAGNOSIS — G8929 Other chronic pain: Secondary | ICD-10-CM | POA: Diagnosis not present

## 2017-06-04 DIAGNOSIS — M7542 Impingement syndrome of left shoulder: Secondary | ICD-10-CM | POA: Diagnosis not present

## 2017-06-06 DIAGNOSIS — G8929 Other chronic pain: Secondary | ICD-10-CM | POA: Diagnosis not present

## 2017-06-06 DIAGNOSIS — M25552 Pain in left hip: Secondary | ICD-10-CM | POA: Diagnosis not present

## 2017-06-06 DIAGNOSIS — M545 Low back pain: Secondary | ICD-10-CM | POA: Diagnosis not present

## 2017-06-06 DIAGNOSIS — M9903 Segmental and somatic dysfunction of lumbar region: Secondary | ICD-10-CM | POA: Diagnosis not present

## 2017-06-06 DIAGNOSIS — M5136 Other intervertebral disc degeneration, lumbar region: Secondary | ICD-10-CM | POA: Diagnosis not present

## 2017-06-06 DIAGNOSIS — M25551 Pain in right hip: Secondary | ICD-10-CM | POA: Diagnosis not present

## 2017-06-06 DIAGNOSIS — M79651 Pain in right thigh: Secondary | ICD-10-CM | POA: Diagnosis not present

## 2017-06-13 DIAGNOSIS — M545 Low back pain: Secondary | ICD-10-CM | POA: Diagnosis not present

## 2017-06-13 DIAGNOSIS — G8929 Other chronic pain: Secondary | ICD-10-CM | POA: Diagnosis not present

## 2017-06-27 DIAGNOSIS — Z23 Encounter for immunization: Secondary | ICD-10-CM | POA: Diagnosis not present

## 2017-07-16 DIAGNOSIS — M7542 Impingement syndrome of left shoulder: Secondary | ICD-10-CM | POA: Diagnosis not present

## 2017-08-15 DIAGNOSIS — Z1329 Encounter for screening for other suspected endocrine disorder: Secondary | ICD-10-CM | POA: Diagnosis not present

## 2017-08-15 DIAGNOSIS — Z13220 Encounter for screening for lipoid disorders: Secondary | ICD-10-CM | POA: Diagnosis not present

## 2017-08-15 DIAGNOSIS — Z0000 Encounter for general adult medical examination without abnormal findings: Secondary | ICD-10-CM | POA: Diagnosis not present

## 2017-08-15 DIAGNOSIS — Z114 Encounter for screening for human immunodeficiency virus [HIV]: Secondary | ICD-10-CM | POA: Diagnosis not present

## 2017-08-17 DIAGNOSIS — Z6827 Body mass index (BMI) 27.0-27.9, adult: Secondary | ICD-10-CM | POA: Diagnosis not present

## 2017-08-17 DIAGNOSIS — Z23 Encounter for immunization: Secondary | ICD-10-CM | POA: Diagnosis not present

## 2017-08-17 DIAGNOSIS — Z01419 Encounter for gynecological examination (general) (routine) without abnormal findings: Secondary | ICD-10-CM | POA: Diagnosis not present

## 2017-08-17 DIAGNOSIS — Z118 Encounter for screening for other infectious and parasitic diseases: Secondary | ICD-10-CM | POA: Diagnosis not present

## 2017-08-17 DIAGNOSIS — Z0000 Encounter for general adult medical examination without abnormal findings: Secondary | ICD-10-CM | POA: Diagnosis not present

## 2017-08-17 DIAGNOSIS — N760 Acute vaginitis: Secondary | ICD-10-CM | POA: Diagnosis not present

## 2017-08-18 ENCOUNTER — Other Ambulatory Visit: Payer: Self-pay | Admitting: Family Medicine

## 2017-08-18 DIAGNOSIS — Z1231 Encounter for screening mammogram for malignant neoplasm of breast: Secondary | ICD-10-CM

## 2017-09-26 ENCOUNTER — Ambulatory Visit
Admission: RE | Admit: 2017-09-26 | Discharge: 2017-09-26 | Disposition: A | Discharge status: Home / Self Care | Payer: BLUE CROSS/BLUE SHIELD | Source: Ambulatory Visit | Attending: Family Medicine | Admitting: Family Medicine

## 2017-09-26 DIAGNOSIS — Z1231 Encounter for screening mammogram for malignant neoplasm of breast: Secondary | ICD-10-CM

## 2017-10-04 DIAGNOSIS — Z6827 Body mass index (BMI) 27.0-27.9, adult: Secondary | ICD-10-CM | POA: Diagnosis not present

## 2017-10-04 DIAGNOSIS — J040 Acute laryngitis: Secondary | ICD-10-CM | POA: Diagnosis not present

## 2017-10-04 DIAGNOSIS — J069 Acute upper respiratory infection, unspecified: Secondary | ICD-10-CM | POA: Diagnosis not present

## 2017-10-04 DIAGNOSIS — J029 Acute pharyngitis, unspecified: Secondary | ICD-10-CM | POA: Diagnosis not present

## 2017-11-13 DIAGNOSIS — I10 Essential (primary) hypertension: Secondary | ICD-10-CM | POA: Diagnosis not present

## 2017-11-13 DIAGNOSIS — N926 Irregular menstruation, unspecified: Secondary | ICD-10-CM | POA: Diagnosis not present

## 2017-11-13 DIAGNOSIS — Z6826 Body mass index (BMI) 26.0-26.9, adult: Secondary | ICD-10-CM | POA: Diagnosis not present

## 2017-11-13 DIAGNOSIS — H6121 Impacted cerumen, right ear: Secondary | ICD-10-CM | POA: Diagnosis not present

## 2017-11-13 DIAGNOSIS — E876 Hypokalemia: Secondary | ICD-10-CM | POA: Diagnosis not present

## 2017-11-13 DIAGNOSIS — E782 Mixed hyperlipidemia: Secondary | ICD-10-CM | POA: Diagnosis not present

## 2017-11-20 ENCOUNTER — Other Ambulatory Visit: Payer: Self-pay

## 2017-11-20 ENCOUNTER — Ambulatory Visit: Payer: BLUE CROSS/BLUE SHIELD | Attending: Family Medicine

## 2017-11-20 DIAGNOSIS — M25652 Stiffness of left hip, not elsewhere classified: Secondary | ICD-10-CM | POA: Insufficient documentation

## 2017-11-20 DIAGNOSIS — M25651 Stiffness of right hip, not elsewhere classified: Secondary | ICD-10-CM

## 2017-11-20 DIAGNOSIS — M545 Low back pain: Secondary | ICD-10-CM | POA: Diagnosis not present

## 2017-11-20 DIAGNOSIS — G8929 Other chronic pain: Secondary | ICD-10-CM | POA: Diagnosis not present

## 2017-11-20 NOTE — Patient Instructions (Addendum)
Perform all exercises below:  Hold _20___ seconds. Repeat _3___ times.  Do __3__ sessions per day. CAUTION: Movement should be gentle, steady and slow.  Knee to Chest  Lying supine, bend involved knee to chest. Perform with each leg.  Copyright  VHI. All rights reserved.  Double Knee to Chest (Flexion)   Gently pull both knees toward chest. Feel stretch in lower back or buttock area. Breathing deeply, Lumbar Rotation: Caudal - Bilateral (Supine)  Feet and knees together, arms outstretched, rotate knees left, turning head in opposite direction, until stretch is felt.      HIP: Hamstrings - Short Sitting   Rest leg on raised surface. Keep knee straight. Lift chest.   Piriformis Stretch, Sitting    Sit, one ankle on opposite knee, same-side hand on crossed knee. Push down on knee, keeping spine straight. Lean torso forward, with flat back, until tension is felt in hamstrings and gluteals of crossed-leg side. Hold _20__ seconds.  Repeat _3__ times per session. Do _3__ sessions per day.  Copyright  VHI. All rights reserved.    [facility] [address] [place], [place] [address] Phone # [phone] Fax [phone]

## 2017-11-20 NOTE — Therapy (Signed)
[facility] [address], [place], [place], [address] Phone: [phone]   Fax:  [phone]  Physical Therapy Evaluation  Patient Details  Name: Mariah Irwin MRN: [id number] Date of Birth: 1971/02/26 Referring Provider: Rachell Cipro, MD   Encounter Date: 11/20/2017  PT End of Session - 11/20/17 1145    Visit Number  1    Date for PT Re-Evaluation  01/15/18    Authorization Type  BC[employer]-20 visit limit    Authorization - Visit Number  1    Authorization - Number of Visits  20    PT Start Time  7616    PT Stop Time  1145    PT Time Calculation (min)  43 min    Activity Tolerance  Patient tolerated treatment well    Behavior During Therapy  [facility] for tasks assessed/performed       Past Medical History:  Diagnosis Date  . Hypercholesteremia   . Hypertension     History reviewed. No pertinent surgical history.  There were no vitals filed for this visit.   Subjective Assessment - 11/20/17 1107    Subjective  Pt presents to PT with chronic history of intermittent LBP.  Latest flare-up was ~1 month ago and she has been feeling better over the past few days.  Pt reports that pain often goes into Rt or Lt gluteal area.  Pt exercises regularly and has recently reduced weights and high impact.      Pertinent History  none    Limitations  Sitting    How long can you sit comfortably?  sometimes can't sit at all- this varies    How long can you stand comfortably?  none    How long can you walk comfortably?  none    Diagnostic tests  x-rays: not sure when    Patient Stated Goals  reduce pain, return to regular exercise, sit without limitation    Currently in Pain?  Yes    Pain Score  2  2-4/10 over the past week    Pain Location  Back    Pain Orientation  Lower    Pain Descriptors / Indicators  Aching;Sore    Pain Type  Chronic pain    Pain Onset  More than a month ago    Pain Frequency  Intermittent    Aggravating  Factors   sitting, bending down (loading dishwasher), vacuuming    Pain Relieving Factors  usually nothing helps, sometimes lying flat on the floor         [facility] PT Assessment - 11/20/17 0001      Assessment   Medical Diagnosis  back pain     Referring Provider  Rachell Cipro, MD    Onset Date/Surgical Date  10/23/17    Prior Therapy  6 months ago      Precautions   Precautions  None      Balance Screen   Has the patient fallen in the past 6 months  No    Has the patient had a decrease in activity level because of a fear of falling?   No    Is the patient reluctant to leave their home because of a fear of falling?   No      Home Social worker  Private residence    Living Arrangements  Children;Spouse/significant other      Prior Function   Level of [place]  Full  time employment    Vocation Requirements  sitting and standing: professor    Leisure  exercise, hiking,  reading      Cognition   Overall Cognitive Status  Within Functional Limits for tasks assessed      Observation/Other Assessments   Focus on Therapeutic Outcomes (FOTO)   30% limitation      Posture/Postural Control   Posture/Postural Control  Postural limitations    Postural Limitations  Decreased lumbar lordosis      ROM / Strength   AROM / PROM / Strength  AROM;PROM;Strength      AROM   Overall AROM   Deficits    Overall AROM Comments  lumbar A/ROM is full with reduced lumbar segmental mobility with flexion and sidebending, bil hip flexibility limited by 25% into hip IR/ER      PROM   Overall PROM   Deficits    Overall PROM Comments  bil hip IR/ER limited by 25%       Strength   Overall Strength  Within functional limits for tasks performed    Overall Strength Comments  5/5 LE strength      Palpation   Spinal mobility  reduced spinal mobility L1-5 without pain    Palpation comment  tension and trigger points in bil lumbar paraspinals and bil  proximal gluteals      Transfers   Transfers  Independent with all Transfers             Objective measurements completed on examination: See above findings.              PT Education - 11/20/17 1132    Education provided  Yes    Education Details  flexibility    Person(s) Educated  Patient    Methods  Explanation;Demonstration;Handout    Comprehension  Verbalized understanding;Returned demonstration       PT Short Term Goals - 11/20/17 1145      PT SHORT TERM GOAL #1   Title  be independent in initial HEP    Time  4    Period  Weeks    Status  New    Target Date  12/18/17      PT SHORT TERM GOAL #2   Title  report ability to perform housework without increased LBP    Time  4    Period  Weeks    Status  New    Target Date  12/18/17      PT SHORT TERM GOAL #3   Title  verbalize and demonstrate correct body mechanics modifications for lumbar protection with home actities and gym exercises    Time  4    Period  Weeks    Status  New    Target Date  12/18/17        PT Long Term Goals - 11/20/17 1219      PT LONG TERM GOAL #1   Title  be independent in advanced HEP    Time  8    Period  Weeks    Status  New    Target Date  01/15/18      PT LONG TERM GOAL #2   Title  reduce FOTO to < or = to 23% limitation    Time  8    Period  Weeks    Status  New    Target Date  01/15/18      PT LONG TERM GOAL #3   Title  sit for meetings at work  without limitation due to LBP    Time  8    Period  Weeks    Status  New    Target Date  01/15/18      PT LONG TERM GOAL #4   Title  perform reuglar gym exercises without significant modification due to LBP    Time  8    Period  Weeks    Status  New    Target Date  01/15/18      PT LONG TERM GOAL #5   Title  report 75% reduction in LBP with daily activities and housework    Time  8    Period  Weeks    Status  New    Target Date  01/15/18             Plan - 11/20/17 1221    Clinical  Impression Statement  Pt reports to PT with complaints of chronic and intermittent LBP.  Last flare-up began ~1 month ago.  Pt reports that she is a regular exerciser and has made modifications to this program as she feels that her exercise might be contributing to her back pain.  Pt denies any pain with exercise.  Pt reports that when her pain is increased that she is not able to sit for > 5-10 minutes.  Pt demonstrates reduced hip flexibility, reduced segmental mobility in the lumbar spine and trigger points/tension in bil lumbar paraspinals and upper gluteals.  Pt will benefit from skilled PT for hip flexibility, instruction in gym exercise to protect lumbar spine, dry needling and manual for imrpoved segmental mobility and reduced pain/trigger points.      Clinical Presentation  Stable    Clinical Presentation due to:  no comorbidities    Clinical Decision Making  Low    Rehab Potential  Good    PT Frequency  2x / week    PT Duration  8 weeks    PT Treatment/Interventions  ADLs/Self Care Home Management;Cryotherapy;Electrical Stimulation;Moist Heat;Traction;Ultrasound;Therapeutic exercise;Neuromuscular re-education;Manual techniques;Passive range of motion;Dry needling;Taping;Functional mobility training;Therapeutic activities;Patient/family education    PT Next Visit Plan  dry needling to lumbar multifidi and proximal gluteals, hip flexibility, body mechanics with gym exercises    Consulted and Agree with Plan of Care  Patient       Patient will benefit from skilled therapeutic intervention in order to improve the following deficits and impairments:  Impaired flexibility, Decreased activity tolerance, Decreased range of motion, Pain  Visit Diagnosis: Chronic bilateral low back pain without sciatica - Plan: PT plan of care cert/re-cert  Stiffness of left hip, not elsewhere classified - Plan: PT plan of care cert/re-cert  Stiffness of right hip, not elsewhere classified - Plan: PT plan of care  cert/re-cert     Problem List Patient Active Problem List   Diagnosis Date Noted  . Dyslipidemia 06/04/2014  . Essential hypertension, benign 05/15/2014   Sigurd Sos, PT 11/20/17 12:34 PM  [facility] [address], [place], [place], [address] Phone: [phone]   Fax:  [phone]  Name: MARBELLA MARKGRAF MRN: [id number] Date of Birth: March 08, 1971

## 2017-11-21 ENCOUNTER — Ambulatory Visit: Payer: BLUE CROSS/BLUE SHIELD

## 2017-11-21 DIAGNOSIS — M25651 Stiffness of right hip, not elsewhere classified: Secondary | ICD-10-CM | POA: Diagnosis not present

## 2017-11-21 DIAGNOSIS — M545 Low back pain: Secondary | ICD-10-CM | POA: Diagnosis not present

## 2017-11-21 DIAGNOSIS — G8929 Other chronic pain: Secondary | ICD-10-CM

## 2017-11-21 DIAGNOSIS — M25652 Stiffness of left hip, not elsewhere classified: Secondary | ICD-10-CM

## 2017-11-21 IMAGING — MG MM DIGITAL SCREENING BILAT
4 series · 4 of 4 positions shown · non-contrast
Comparison: Previous exam(s).

CLINICAL DATA: Screening.

EXAM:
DIGITAL SCREENING BILATERAL MAMMOGRAM WITH CAD

[R CC]
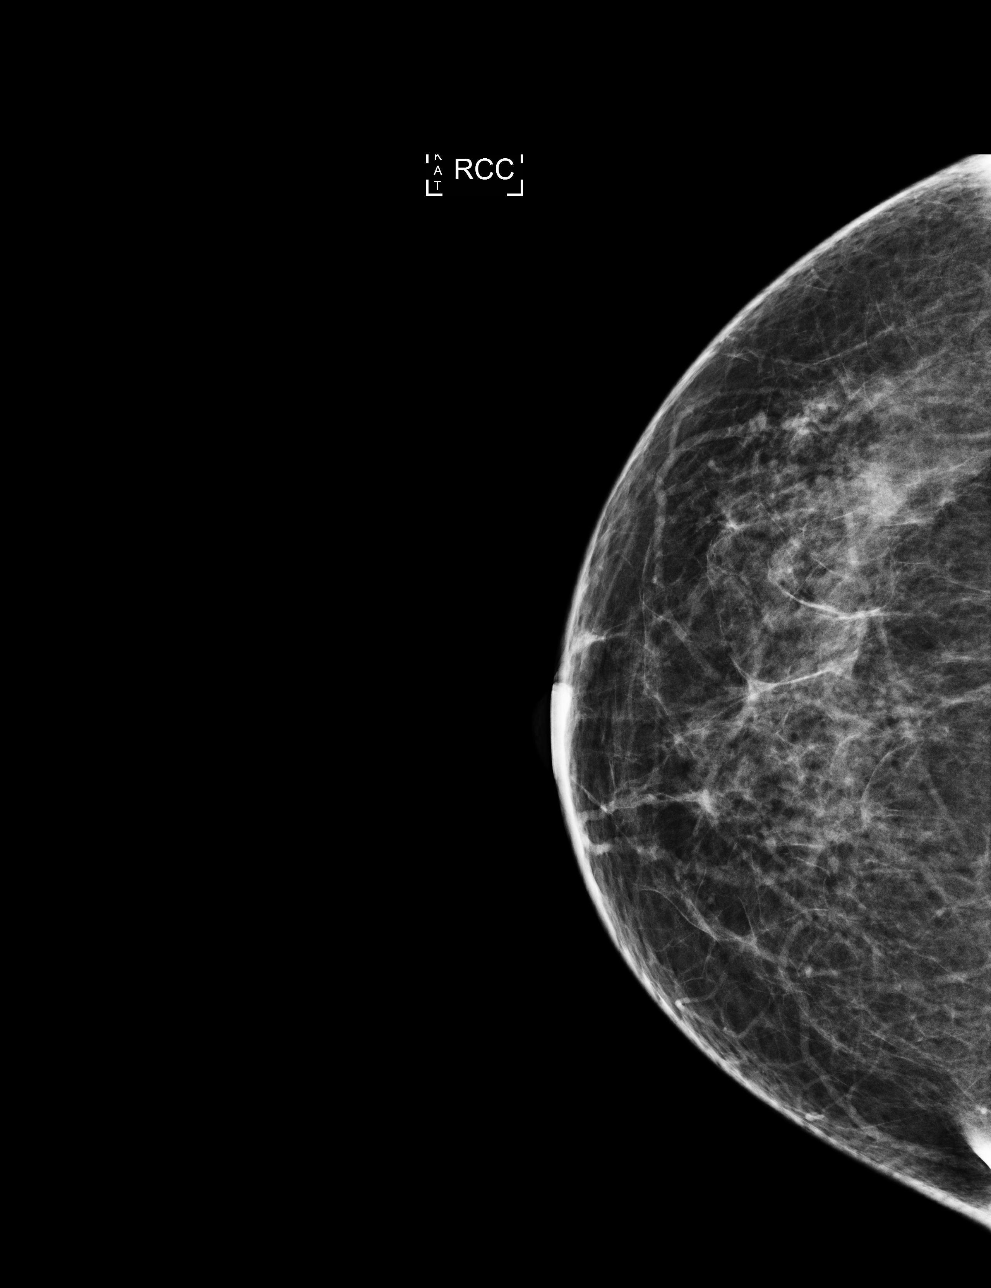

[R MLO]
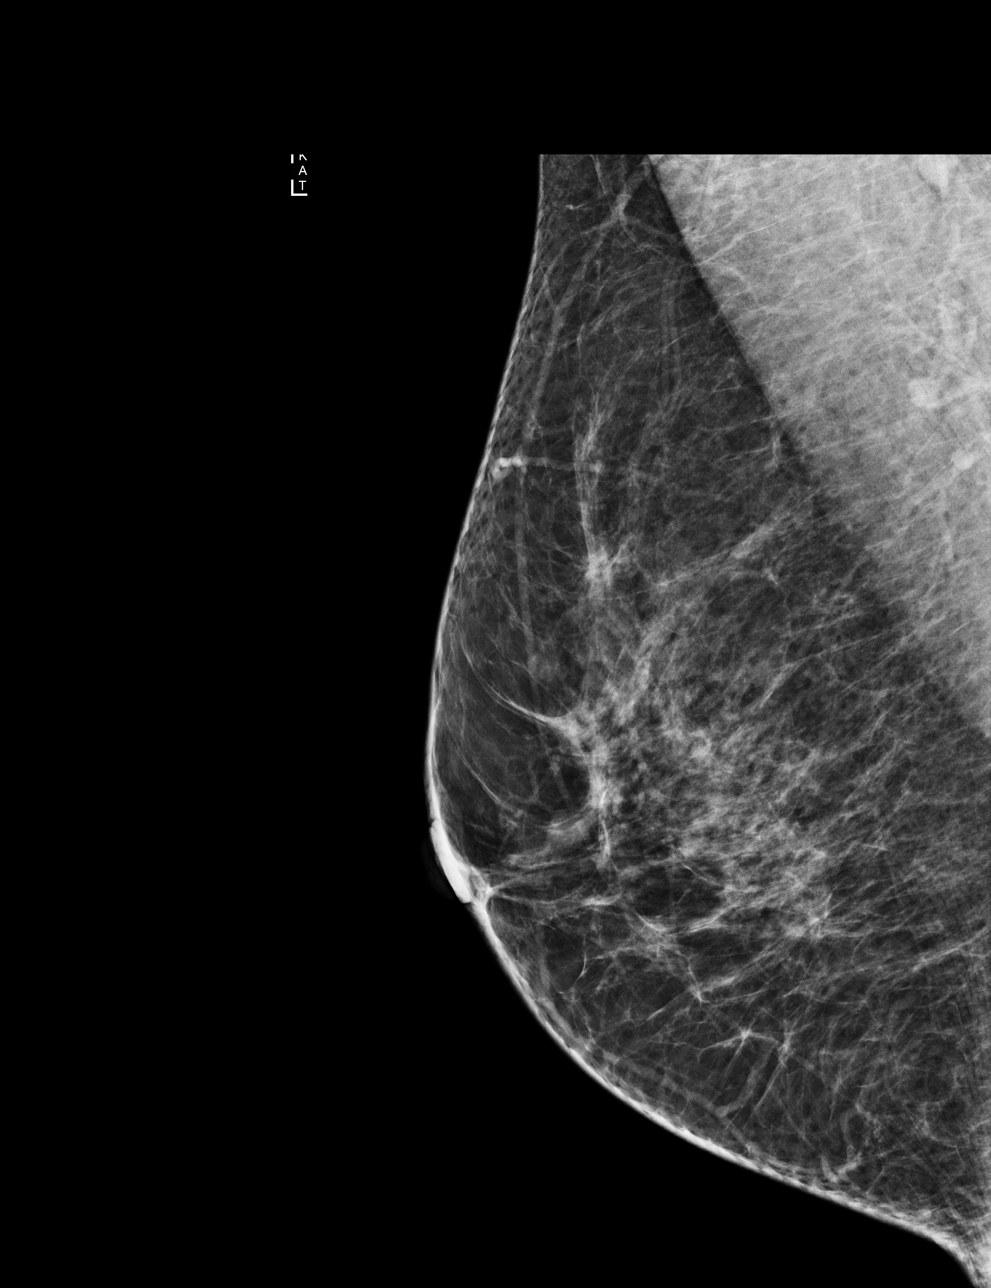

[L MLO]
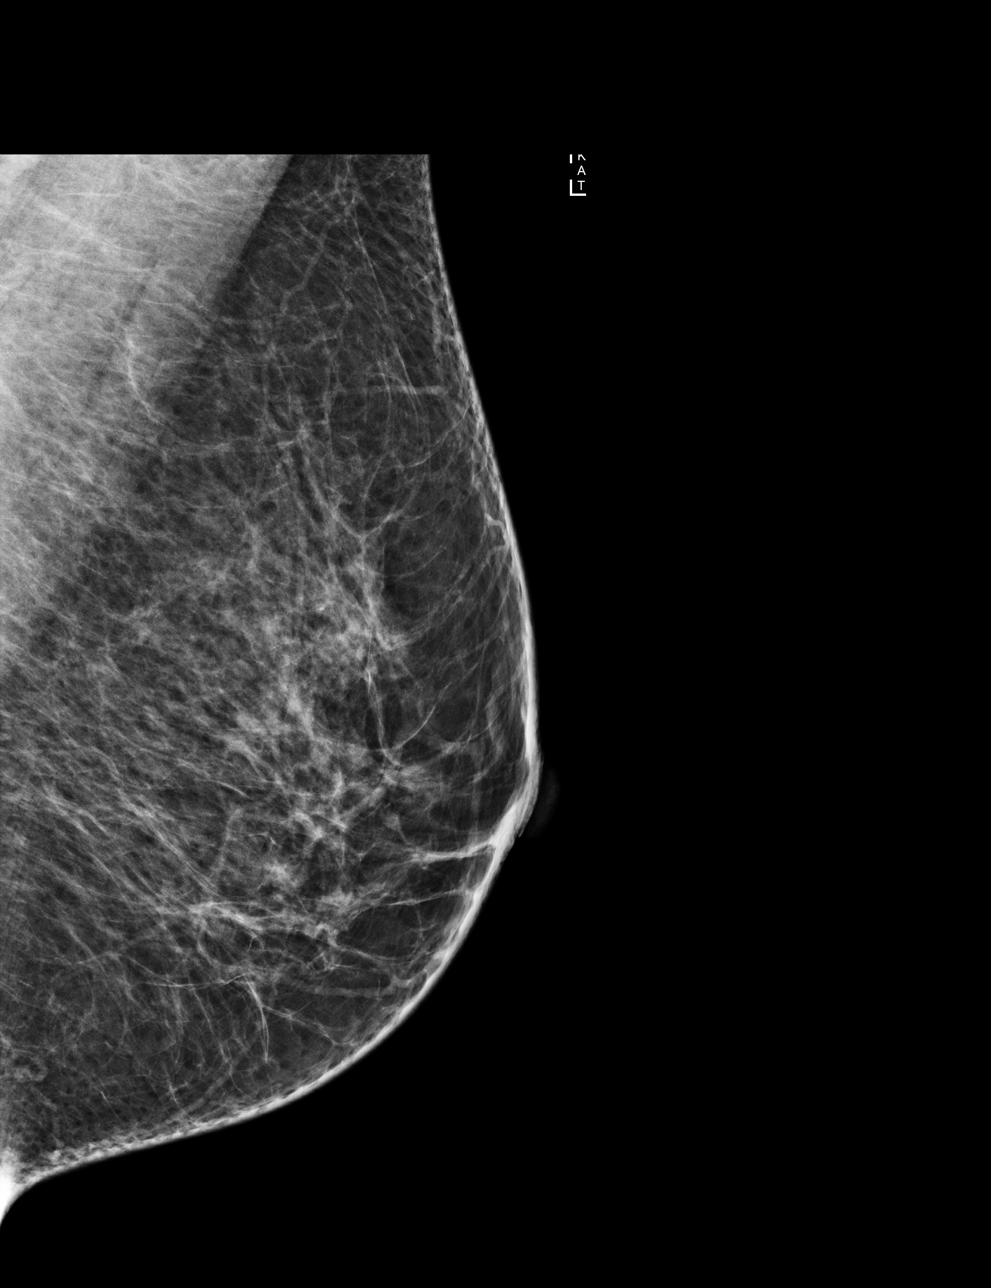

[L CC]
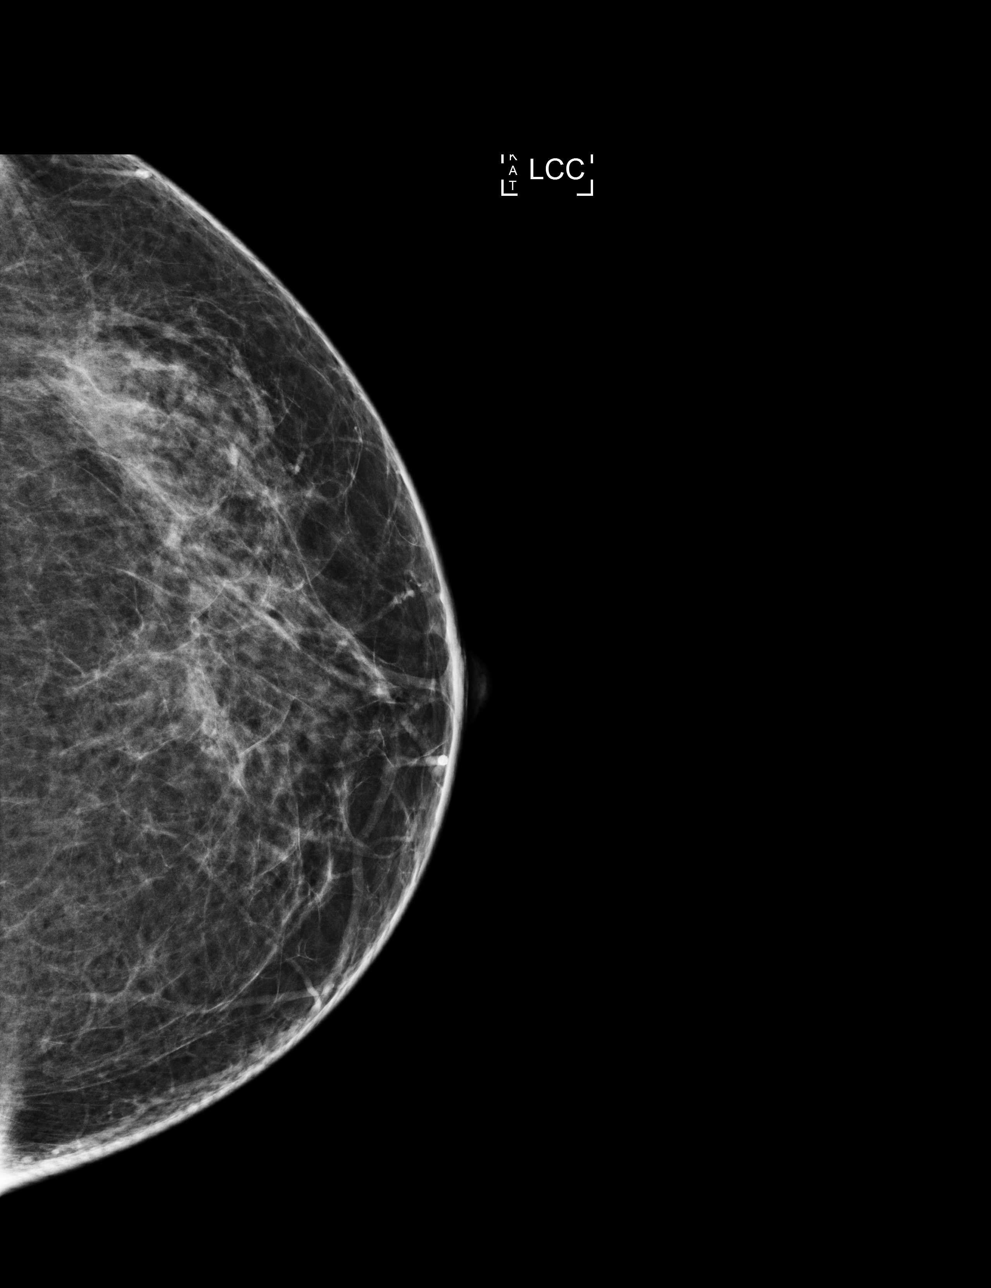

[4 of 4 positions shown; findings below may reference images not displayed]

ACR Breast Density Category c: The breast tissue is heterogeneously
dense, which may obscure small masses.
FINDINGS: There are no findings suspicious for malignancy. Images were
processed with CAD.
IMPRESSION: No mammographic evidence of malignancy. A result letter of this
screening mammogram will be mailed directly to the patient.

RECOMMENDATION:
Screening mammogram in one year. (Code:[id number]-[id number])

BI-RADS CATEGORY  1: Negative.

## 2017-11-21 NOTE — Patient Instructions (Addendum)

## 2017-11-21 NOTE — Therapy (Signed)
[facility] [address], [place], [place], [address] Phone: [phone]   Fax:  [phone]  Physical Therapy Treatment  Patient Details  Name: Mariah Irwin MRN: [id number] Date of Birth: [birth year] Referring Provider: Rachell Cipro, MD   Encounter Date: 11/21/2017  PT End of Session - 11/21/17 1226    Visit Number  2    Date for PT Re-Evaluation  01/15/18    Authorization Type  BCBS-20 visit limit    Authorization - Visit Number  2    Authorization - Number of Visits  20    PT Start Time  1147 dry needling    PT Stop Time  1232    PT Time Calculation (min)  45 min    Activity Tolerance  Patient tolerated treatment well    Behavior During Therapy  [facility] for tasks assessed/performed       Past Medical History:  Diagnosis Date  . Hypercholesteremia   . Hypertension     History reviewed. No pertinent surgical history.  There were no vitals filed for this visit.  Subjective Assessment - 11/21/17 1148    Subjective  Not too differnent since yesterday.      Currently in Pain?  Yes    Pain Score  2     Pain Location  Back    Pain Orientation  Lower                      [facility] Adult PT Treatment/Exercise - 11/21/17 0001      Modalities   Modalities  Moist Heat      Moist Heat Therapy   Number Minutes Moist Heat  10 Minutes    Moist Heat Location  Lumbar Spine      Manual Therapy   Manual Therapy  Soft tissue mobilization;Myofascial release    Manual therapy comments  soft tissue elongation and trigger point release to bil gluteals and lumbar paraspinals       Trigger Point Dry Needling - 11/21/17 1150    Consent Given?  Yes    Education Handout Provided  Yes    Muscles Treated Lower Body  Gluteus minimus;Gluteus maximus bil lumbar multifidi    Gluteus Maximus Response  Twitch response elicited;Palpable increased muscle length    Gluteus Minimus Response  Twitch response  elicited;Palpable increased muscle length           PT Education - 11/21/17 1222    Education provided  Yes    Education Details  DN info    Person(s) Educated  Patient    Methods  Explanation;Demonstration;Handout    Comprehension  Verbalized understanding;Returned demonstration       PT Short Term Goals - 11/20/17 1145      PT SHORT TERM GOAL #1   Title  be independent in initial HEP    Time  4    Period  Weeks    Status  New    Target Date  12/18/17      PT SHORT TERM GOAL #2   Title  report ability to perform housework without increased LBP    Time  4    Period  Weeks    Status  New    Target Date  12/18/17      PT SHORT TERM GOAL #3   Title  verbalize and demonstrate correct body mechanics modifications for lumbar protection with home actities and gym exercises    Time  4  Period  Weeks    Status  New    Target Date  12/18/17        PT Long Term Goals - 11/20/17 1219      PT LONG TERM GOAL #1   Title  be independent in advanced HEP    Time  8    Period  Weeks    Status  New    Target Date  01/15/18      PT LONG TERM GOAL #2   Title  reduce FOTO to < or = to 23% limitation    Time  8    Period  Weeks    Status  New    Target Date  01/15/18      PT LONG TERM GOAL #3   Title  sit for meetings at work without limitation due to LBP    Time  8    Period  Weeks    Status  New    Target Date  01/15/18      PT LONG TERM GOAL #4   Title  perform reuglar gym exercises without significant modification due to LBP    Time  8    Period  Weeks    Status  New    Target Date  01/15/18      PT LONG TERM GOAL #5   Title  report 75% reduction in LBP with daily activities and housework    Time  8    Period  Weeks    Status  New    Target Date  01/15/18            Plan - 11/21/17 1223    Clinical Impression Statement  Pt with first session after evaluation and treatment focused on dry needling and manual therapy to gluteals and lumbar  paraspinals.  Pt has been doing initial stretches issued yesterday.  Pt with trigger points and tension in Lt>Rt lumbar paraspinals and gluteals.  Pt with improved tissue mobility and ease of movement after dry needling and manual therapy today.  Pt will continue to benefit from skilled PT for lumbar and hip flexibility, core strength, manual therapy and modalities for tension and pain.      Rehab Potential  Good    PT Frequency  2x / week    PT Duration  8 weeks    PT Treatment/Interventions  ADLs/Self Care Home Management;Cryotherapy;Electrical Stimulation;Moist Heat;Traction;Ultrasound;Therapeutic exercise;Neuromuscular re-education;Manual techniques;Passive range of motion;Dry needling;Taping;Functional mobility training;Therapeutic activities;Patient/family education    PT Next Visit Plan  assess response dry needling to lumbar multifidi and proximal gluteals and repeat if helpful, hip flexibility, body mechanics with gym exercises    Consulted and Agree with Plan of Care  Patient       Patient will benefit from skilled therapeutic intervention in order to improve the following deficits and impairments:  Impaired flexibility, Decreased activity tolerance, Decreased range of motion, Pain  Visit Diagnosis: Chronic bilateral low back pain without sciatica  Stiffness of left hip, not elsewhere classified  Stiffness of right hip, not elsewhere classified     Problem List Patient Active Problem List   Diagnosis Date Noted  . Dyslipidemia 06/04/2014  . Essential hypertension, benign 05/15/2014    Sigurd Sos, PT 11/21/17 12:27 PM  [facility] [address], [place], [place], [address] Phone: [phone]   Fax:  [phone]  Name: Mariah Irwin MRN: [id number] Date of Birth: [birth year]

## 2017-11-29 ENCOUNTER — Encounter: Payer: Self-pay | Admitting: Physical Therapy

## 2017-11-29 ENCOUNTER — Ambulatory Visit: Payer: BLUE CROSS/BLUE SHIELD | Admitting: Physical Therapy

## 2017-11-29 DIAGNOSIS — M545 Low back pain: Secondary | ICD-10-CM | POA: Diagnosis not present

## 2017-11-29 DIAGNOSIS — M25652 Stiffness of left hip, not elsewhere classified: Secondary | ICD-10-CM

## 2017-11-29 DIAGNOSIS — G8929 Other chronic pain: Secondary | ICD-10-CM | POA: Diagnosis not present

## 2017-11-29 DIAGNOSIS — M25651 Stiffness of right hip, not elsewhere classified: Secondary | ICD-10-CM | POA: Diagnosis not present

## 2017-11-29 NOTE — Patient Instructions (Addendum)

## 2017-11-29 NOTE — Therapy (Signed)
[facility] [address], [place], [place], [address] Phone: [phone]   Fax:  [phone]  Physical Therapy Treatment  Patient Details  Name: Mariah Irwin MRN: [id number] Date of Birth: 1971-01-03 Referring Provider: Rachell Cipro, MD   Encounter Date: 11/29/2017  PT End of Session - 11/29/17 1422    Visit Number  3    Date for PT Re-Evaluation  01/15/18    Authorization Type  BCBS-20 visit limit    Authorization - Visit Number  3    Authorization - Number of Visits  20    PT Start Time  9924    PT Stop Time  1445 dry needling     PT Time Calculation (min)  40 min    Activity Tolerance  Patient tolerated treatment well;No increased pain    Behavior During Therapy  [facility] for tasks assessed/performed       Past Medical History:  Diagnosis Date  . Hypercholesteremia   . Hypertension     History reviewed. No pertinent surgical history.  There were no vitals filed for this visit.  Subjective Assessment - 11/29/17 1407    Subjective  Pt reports that over the past week she was not able to complete her exercises because she was up in [place] for her daughter's field trip. They did alot of walking which seemed to aggravate the right side of her back more than the left. She thinks the needling helped alot.     Currently in Pain?  No/denies                      [facility] Adult PT Treatment/Exercise - 11/29/17 0001      Exercises   Exercises  Lumbar      Lumbar Exercises: Stretches   Piriformis Stretch  2 reps;30 seconds    Piriformis Stretch Limitations  seated     Other Lumbar Stretch Exercise  Lt and Rt hip flexor stretch in thomas position off table 3x30 sec each      Lumbar Exercises: Supine   Isometric Hip Flexion  10 reps;2 seconds    Isometric Hip Flexion Limitations  oblique isometric     Other Supine Lumbar Exercises  bent knee 90/90 with alternating heel tap x15 reps each side (1  rest break needed)       Manual Therapy   Manual therapy comments  STM Lt and Rt lumbar paraspinals        Trigger Point Dry Needling - 11/29/17 1422    Consent Given?  Yes    Education Handout Provided  Yes    Muscles Treated Lower Body  -- L3 to L5 Lt/Rt lumbar multifdi (+) twitch response noted            PT Education - 11/29/17 1421    Education provided  Yes    Education Details  DN info; technique with therex     Person(s) Educated  Patient    Methods  Explanation;Handout;Verbal cues    Comprehension  Verbalized understanding;Returned demonstration       PT Short Term Goals - 11/20/17 1145      PT SHORT TERM GOAL #1   Title  be independent in initial HEP    Time  4    Period  Weeks    Status  New    Target Date  12/18/17      PT SHORT TERM GOAL #2   Title  report ability  to perform housework without increased LBP    Time  4    Period  Weeks    Status  New    Target Date  12/18/17      PT SHORT TERM GOAL #3   Title  verbalize and demonstrate correct body mechanics modifications for lumbar protection with home actities and gym exercises    Time  4    Period  Weeks    Status  New    Target Date  12/18/17        PT Long Term Goals - 11/20/17 1219      PT LONG TERM GOAL #1   Title  be independent in advanced HEP    Time  8    Period  Weeks    Status  New    Target Date  01/15/18      PT LONG TERM GOAL #2   Title  reduce FOTO to < or = to 23% limitation    Time  8    Period  Weeks    Status  New    Target Date  01/15/18      PT LONG TERM GOAL #3   Title  sit for meetings at work without limitation due to LBP    Time  8    Period  Weeks    Status  New    Target Date  01/15/18      PT LONG TERM GOAL #4   Title  perform reuglar gym exercises without significant modification due to LBP    Time  8    Period  Weeks    Status  New    Target Date  01/15/18      PT LONG TERM GOAL #5   Title  report 75% reduction in LBP with daily activities  and housework    Time  8    Period  Weeks    Status  New    Target Date  01/15/18            Plan - 11/29/17 1451    Clinical Impression Statement  Pt has been out of town and unable to complete her HEP as much as she hoped. Needling seemed to help a lot last session. Today's session focused on therex to address hip flexibility and introduce deep abdominal activation and strengthening. Completed second round of dry needling, noting increased resistance and more twitch responses on the Rt lumbar paraspinals/multifidi than on the Lt. Pt reported no increase in pain end of session, but did notice further improvements in tightness of the low back. Will continue with current POC.    Rehab Potential  Good    PT Frequency  2x / week    PT Duration  8 weeks    PT Treatment/Interventions  ADLs/Self Care Home Management;Cryotherapy;Electrical Stimulation;Moist Heat;Traction;Ultrasound;Therapeutic exercise;Neuromuscular re-education;Manual techniques;Passive range of motion;Dry needling;Taping;Functional mobility training;Therapeutic activities;Patient/family education    PT Next Visit Plan  hip flexibility, promote trunk strength/endurance body mechanics with gym exercises    Recommended Other Services  MD signed orders     Consulted and Agree with Plan of Care  Patient       Patient will benefit from skilled therapeutic intervention in order to improve the following deficits and impairments:  Impaired flexibility, Decreased activity tolerance, Decreased range of motion, Pain  Visit Diagnosis: Chronic bilateral low back pain without sciatica  Stiffness of left hip, not elsewhere classified  Stiffness of right hip, not elsewhere classified  Problem List Patient Active Problem List   Diagnosis Date Noted  . Dyslipidemia 06/04/2014  . Essential hypertension, benign 05/15/2014    3:12 PM,11/29/17 Sherol Dade PT, DPT [facility] [address], [place], [place], [address] Phone: [phone]   Fax:  [phone]  Name: Mariah Irwin MRN: [id number] Date of Birth: June 07, 1971

## 2017-12-04 ENCOUNTER — Ambulatory Visit: Payer: BLUE CROSS/BLUE SHIELD

## 2017-12-04 DIAGNOSIS — M25652 Stiffness of left hip, not elsewhere classified: Secondary | ICD-10-CM | POA: Diagnosis not present

## 2017-12-04 DIAGNOSIS — M545 Low back pain: Secondary | ICD-10-CM | POA: Diagnosis not present

## 2017-12-04 DIAGNOSIS — M25651 Stiffness of right hip, not elsewhere classified: Secondary | ICD-10-CM | POA: Diagnosis not present

## 2017-12-04 DIAGNOSIS — G8929 Other chronic pain: Secondary | ICD-10-CM | POA: Diagnosis not present

## 2017-12-04 NOTE — Therapy (Signed)
[facility] [address] [place], [place], [address] Phone: [phone]   Fax:  [phone]  Physical Therapy Treatment  Patient Details  Name: Mariah Irwin MRN: [id number] Date of Birth: 11-12-1970 Referring Provider: Rachell Cipro, MD   Encounter Date: 12/04/2017  PT End of Session - 12/04/17 1141    Visit Number  4    Date for PT Re-Evaluation  01/15/18    Authorization Type  BCBS-20 visit limit    Authorization - Visit Number  4    Authorization - Number of Visits  20    PT Start Time  1110    PT Stop Time  1155    PT Time Calculation (min)  45 min    Activity Tolerance  Patient tolerated treatment well;No increased pain    Behavior During Therapy  WFL for tasks assessed/performed       Past Medical History:  Diagnosis Date  . Hypercholesteremia   . Hypertension     History reviewed. No pertinent surgical history.  There were no vitals filed for this visit.  Subjective Assessment - 12/04/17 1111    Subjective  I am feeling better after recovering after a field trip to [place] with daughter.  Dry needling really helped.      Currently in Pain?  Yes    Pain Score  1     Pain Location  Back    Pain Orientation  Lower    Pain Descriptors / Indicators  Aching;Sore    Pain Type  Chronic pain    Pain Onset  More than a month ago    Pain Frequency  Constant    Aggravating Factors   bending down, housework    Pain Relieving Factors  dry needling                      [facility] Adult PT Treatment/Exercise - 12/04/17 0001      Lumbar Exercises: Stretches   Piriformis Stretch  2 reps;30 seconds    Piriformis Stretch Limitations  seated       Modalities   Modalities  Moist Heat      Moist Heat Therapy   Number Minutes Moist Heat  10 Minutes    Moist Heat Location  Lumbar Spine      Manual Therapy   Manual Therapy  Soft tissue mobilization;Myofascial release    Manual therapy  comments  soft tissue elongation and trigger point release to bil gluteals and lumbar paraspinals       Trigger Point Dry Needling - 12/04/17 1113    Consent Given?  Yes    Education Handout Provided  Yes    Muscles Treated Lower Body  Gluteus minimus;Gluteus maximus bil lumbar multidifi    Gluteus Maximus Response  Twitch response elicited;Palpable increased muscle length    Gluteus Minimus Response  Twitch response elicited;Palpable increased muscle length             PT Short Term Goals - 12/04/17 1112      PT SHORT TERM GOAL #1   Title  be independent in initial HEP    Status  Achieved      PT SHORT TERM GOAL #2   Title  report ability to perform housework without increased LBP    Time  4    Period  Weeks    Status  On-going      PT SHORT TERM GOAL #3   Title  verbalize and demonstrate correct body mechanics modifications for lumbar protection with home actities and gym exercises    Baseline  further instruction is needed    Time  4    Period  Weeks    Status  On-going        PT Long Term Goals - 11/20/17 1219      PT LONG TERM GOAL #1   Title  be independent in advanced HEP    Time  8    Period  Weeks    Status  New    Target Date  01/15/18      PT LONG TERM GOAL #2   Title  reduce FOTO to < or = to 23% limitation    Time  8    Period  Weeks    Status  New    Target Date  01/15/18      PT LONG TERM GOAL #3   Title  sit for meetings at work without limitation due to LBP    Time  8    Period  Weeks    Status  New    Target Date  01/15/18      PT LONG TERM GOAL #4   Title  perform reuglar gym exercises without significant modification due to LBP    Time  8    Period  Weeks    Status  New    Target Date  01/15/18      PT LONG TERM GOAL #5   Title  report 75% reduction in LBP with daily activities and housework    Time  8    Period  Weeks    Status  New    Target Date  01/15/18            Plan - 12/04/17 1139    Clinical Impression  Statement  Pt with limited attendance due to being out of town.  Pt with trigger points in bil lumbar paraspinals and gluteals.  Pt with improved flexibility and mobility after dry needling today.  Pt will continue to benefit from skilled PT for core strength, body mechanics, and hip flexibility.      PT Frequency  2x / week    PT Duration  8 weeks    PT Treatment/Interventions  ADLs/Self Care Home Management;Cryotherapy;Electrical Stimulation;Moist Heat;Traction;Ultrasound;Therapeutic exercise;Neuromuscular re-education;Manual techniques;Passive range of motion;Dry needling;Taping;Functional mobility training;Therapeutic activities;Patient/family education    PT Next Visit Plan  hip flexibility, promote trunk strength/endurance body mechanics with gym exercises    Consulted and Agree with Plan of Care  Patient       Patient will benefit from skilled therapeutic intervention in order to improve the following deficits and impairments:  Impaired flexibility, Decreased activity tolerance, Decreased range of motion, Pain  Visit Diagnosis: Chronic bilateral low back pain without sciatica  Stiffness of left hip, not elsewhere classified  Stiffness of right hip, not elsewhere classified     Problem List Patient Active Problem List   Diagnosis Date Noted  . Dyslipidemia 06/04/2014  . Essential hypertension, benign 05/15/2014     Mariah Irwin, PT 12/04/17 11:43 AM  [facility] [address], [place], [place], [address] Phone: [phone]   Fax:  [phone]  Name: Mariah Irwin MRN: [id number] Date of Birth: 10-07-1970

## 2017-12-06 ENCOUNTER — Encounter: Payer: BLUE CROSS/BLUE SHIELD | Admitting: Physical Therapy

## 2017-12-11 ENCOUNTER — Ambulatory Visit: Payer: BLUE CROSS/BLUE SHIELD

## 2017-12-11 DIAGNOSIS — G8929 Other chronic pain: Secondary | ICD-10-CM | POA: Diagnosis not present

## 2017-12-11 DIAGNOSIS — M25652 Stiffness of left hip, not elsewhere classified: Secondary | ICD-10-CM | POA: Diagnosis not present

## 2017-12-11 DIAGNOSIS — M545 Low back pain: Principal | ICD-10-CM

## 2017-12-11 DIAGNOSIS — M25651 Stiffness of right hip, not elsewhere classified: Secondary | ICD-10-CM

## 2017-12-11 NOTE — Therapy (Signed)
[facility] [address] [place], [place], [address] Phone: [phone]   Fax:  [phone]  Physical Therapy Treatment  Patient Details  Name: Mariah Irwin MRN: [id number] Date of Birth: 15-Dec-1970 Referring Provider: Rachell Cipro, MD   Encounter Date: 12/11/2017  PT End of Session - 12/11/17 1137    Visit Number  5    Date for PT Re-Evaluation  01/15/18    Authorization Type  BCBS-20 visit limit    Authorization - Visit Number  5    Authorization - Number of Visits  20    PT Start Time  1100    PT Stop Time  4034    PT Time Calculation (min)  31 min    Activity Tolerance  Patient tolerated treatment well;No increased pain    Behavior During Therapy  WFL for tasks assessed/performed       Past Medical History:  Diagnosis Date  . Hypercholesteremia   . Hypertension     History reviewed. No pertinent surgical history.  There were no vitals filed for this visit.  Subjective Assessment - 12/11/17 1057    Subjective  Pt was sick last week and still coughing a lot.  Feeling tight from resting a lot.      Currently in Pain?  Yes    Pain Score  0-No pain    Pain Location  Back    Pain Orientation  Lower    Pain Type  Chronic pain    Pain Onset  More than a month ago                No data recorded       [facility] Adult PT Treatment/Exercise - 12/11/17 0001      Lumbar Exercises: Stretches   Single Knee to Chest Stretch  Left;Right;3 reps;20 seconds    Lower Trunk Rotation  3 reps;20 seconds    Piriformis Stretch  2 reps;30 seconds    Piriformis Stretch Limitations  seated       Lumbar Exercises: Supine   Ab Set  15 reps TA activation    Bridge  10 reps;5 seconds    Other Supine Lumbar Exercises  TA with hip abduction      Lumbar Exercises: Sidelying   Clam  Both;20 reps with TA activation             PT Education - 12/11/17 1124    Education provided  Yes    Education  Details  body mechanics, core strength with TA activation    Person(s) Educated  Patient    Methods  Explanation;Handout;Demonstration    Comprehension  Verbalized understanding;Returned demonstration       PT Short Term Goals - 12/04/17 1112      PT SHORT TERM GOAL #1   Title  be independent in initial HEP    Status  Achieved      PT SHORT TERM GOAL #2   Title  report ability to perform housework without increased LBP    Time  4    Period  Weeks    Status  On-going      PT SHORT TERM GOAL #3   Title  verbalize and demonstrate correct body mechanics modifications for lumbar protection with home actities and gym exercises    Baseline  further instruction is needed    Time  4    Period  Weeks    Status  On-going  PT Long Term Goals - 11/20/17 1219      PT LONG TERM GOAL #1   Title  be independent in advanced HEP    Time  8    Period  Weeks    Status  New    Target Date  01/15/18      PT LONG TERM GOAL #2   Title  reduce FOTO to < or = to 23% limitation    Time  8    Period  Weeks    Status  New    Target Date  01/15/18      PT LONG TERM GOAL #3   Title  sit for meetings at work without limitation due to LBP    Time  8    Period  Weeks    Status  New    Target Date  01/15/18      PT LONG TERM GOAL #4   Title  perform reuglar gym exercises without significant modification due to LBP    Time  8    Period  Weeks    Status  New    Target Date  01/15/18      PT LONG TERM GOAL #5   Title  report 75% reduction in LBP with daily activities and housework    Time  8    Period  Weeks    Status  New    Target Date  01/15/18            Plan - 12/11/17 1135    Clinical Impression Statement  Pt has been sick with virus so she has not been able to exercise as much.  Session today focused on progression of HEP for core strength and body mechanics education for lumbar protection.  Pt was not able to lie down for long due to consistent cough.  Pt will benefit  from dry needling next session to address trigger points in lumbar multifidi and gluteals.      Rehab Potential  Good    PT Frequency  2x / week    PT Duration  8 weeks    PT Treatment/Interventions  ADLs/Self Care Home Management;Cryotherapy;Electrical Stimulation;Moist Heat;Traction;Ultrasound;Therapeutic exercise;Neuromuscular re-education;Manual techniques;Passive range of motion;Dry needling;Taping;Functional mobility training;Therapeutic activities;Patient/family education    PT Next Visit Plan  review HEP, dry needling to lumbar multifidi and bil proximal gluteals with soft tissue work to address trigger points.    Consulted and Agree with Plan of Care  Patient       Patient will benefit from skilled therapeutic intervention in order to improve the following deficits and impairments:  Impaired flexibility, Decreased activity tolerance, Decreased range of motion, Pain  Visit Diagnosis: Chronic bilateral low back pain without sciatica  Stiffness of left hip, not elsewhere classified  Stiffness of right hip, not elsewhere classified     Problem List Patient Active Problem List   Diagnosis Date Noted  . Dyslipidemia 06/04/2014  . Essential hypertension, benign 05/15/2014     Sigurd Sos, PT 12/11/17 11:40 AM  [facility] [address] [place], [place], [address] Phone: [phone]   Fax:  [phone]  Name: Mariah Irwin MRN: [id number] Date of Birth: 03-27-1971

## 2017-12-11 NOTE — Patient Instructions (Addendum)
Access Code: [email or web address]  URL: [email or web address]  Date: 12/11/2017  Prepared by: Sigurd Sos   Exercises  Clamshell - 10 reps - 2 sets - 2x daily - 7x weekly  Bridge - 10 reps - 2 sets - 5 hold - 1x daily - 7x weekly  Hooklying Transversus Abdominis Palpation - 10 reps - 3-5 hold - 1x daily - 7x weekly  Supine Transversus Abdominis Bracing with Double Leg Fallout - 10 reps - 1 sets - 1x daily - 7x weekly   Patient Education  Lifting Techniques  Office Posture  Sleep Positions  Log Roll  Low Back Pain  Low Back Pain Handout  [facility] [address], [place], [place] [address] Phone # [phone] Fax [phone]

## 2017-12-13 ENCOUNTER — Ambulatory Visit: Payer: BLUE CROSS/BLUE SHIELD | Admitting: Physical Therapy

## 2017-12-13 ENCOUNTER — Encounter: Payer: Self-pay | Admitting: Physical Therapy

## 2017-12-13 DIAGNOSIS — G8929 Other chronic pain: Secondary | ICD-10-CM | POA: Diagnosis not present

## 2017-12-13 DIAGNOSIS — M25651 Stiffness of right hip, not elsewhere classified: Secondary | ICD-10-CM | POA: Diagnosis not present

## 2017-12-13 DIAGNOSIS — M545 Low back pain: Principal | ICD-10-CM

## 2017-12-13 DIAGNOSIS — M25652 Stiffness of left hip, not elsewhere classified: Secondary | ICD-10-CM | POA: Diagnosis not present

## 2017-12-13 NOTE — Therapy (Signed)
[facility] [address] [place], [place], [address] Phone: [phone]   Fax:  [phone]  Physical Therapy Treatment  Patient Details  Name: Mariah Irwin MRN: [id number] Date of Birth: [birth year] Referring Provider: Rachell Cipro, MD   Encounter Date: 12/13/2017  PT End of Session - 12/13/17 1450    Visit Number  6    Date for PT Re-Evaluation  01/15/18    Authorization Type  BCBS-20 visit limit    Authorization - Visit Number  6    Authorization - Number of Visits  20    PT Start Time  9833    PT Stop Time  1440    PT Time Calculation (min)  38 min    Activity Tolerance  Patient tolerated treatment well;No increased pain    Behavior During Therapy  WFL for tasks assessed/performed       Past Medical History:  Diagnosis Date  . Hypercholesteremia   . Hypertension     History reviewed. No pertinent surgical history.  There were no vitals filed for this visit.  Subjective Assessment - 12/13/17 1358    Subjective  still struggling with cough    Patient Stated Goals  reduce pain, return to regular exercise, sit without limitation    Pain Score  0-No pain                No data recorded       [facility] Adult PT Treatment/Exercise - 12/13/17 1409      Lumbar Exercises: Aerobic   Nustep  L3 x 6 min      Manual Therapy   Manual Therapy  Soft tissue mobilization;Myofascial release    Manual therapy comments  soft tissue elongation and trigger point release to bil gluteals and lumbar paraspinals    Soft tissue mobilization  Rt glutes and bil lumbar paraspinals       Trigger Point Dry Needling - 12/13/17 1439    Consent Given?  Yes    Muscles Treated Lower Body  Gluteus minimus glute med and bil L3-5 multifidi    Gluteus Maximus Response  Twitch response elicited;Palpable increased muscle length and glute med; L3-5 lumbar multifidi             PT Short Term Goals - 12/04/17  1112      PT SHORT TERM GOAL #1   Title  be independent in initial HEP    Status  Achieved      PT SHORT TERM GOAL #2   Title  report ability to perform housework without increased LBP    Time  4    Period  Weeks    Status  On-going      PT SHORT TERM GOAL #3   Title  verbalize and demonstrate correct body mechanics modifications for lumbar protection with home actities and gym exercises    Baseline  further instruction is needed    Time  4    Period  Weeks    Status  On-going        PT Long Term Goals - 11/20/17 1219      PT LONG TERM GOAL #1   Title  be independent in advanced HEP    Time  8    Period  Weeks    Status  New    Target Date  01/15/18      PT LONG TERM GOAL #2   Title  reduce FOTO to < or = to  23% limitation    Time  8    Period  Weeks    Status  New    Target Date  01/15/18      PT LONG TERM GOAL #3   Title  sit for meetings at work without limitation due to LBP    Time  8    Period  Weeks    Status  New    Target Date  01/15/18      PT LONG TERM GOAL #4   Title  perform reuglar gym exercises without significant modification due to LBP    Time  8    Period  Weeks    Status  New    Target Date  01/15/18      PT LONG TERM GOAL #5   Title  report 75% reduction in LBP with daily activities and housework    Time  8    Period  Weeks    Status  New    Target Date  01/15/18            Plan - 12/13/17 1450    Clinical Impression Statement  Pt tolerated session well today, and coughing better today but limited exercises due to pt reporting this would exacerbate cough.  Verbalized understanding of new HEP, and had good response to DN today with improved lumbar mobility and decreased active trigger points.  Will continue to benefit from PT to maximize function.    Rehab Potential  Good    PT Frequency  2x / week    PT Duration  8 weeks    PT Treatment/Interventions  ADLs/Self Care Home Management;Cryotherapy;Electrical Stimulation;Moist  Heat;Traction;Ultrasound;Therapeutic exercise;Neuromuscular re-education;Manual techniques;Passive range of motion;Dry needling;Taping;Functional mobility training;Therapeutic activities;Patient/family education    PT Next Visit Plan  review HEP, assess response to dry needling to lumbar multifidi and bil proximal gluteals with soft tissue work to address trigger points, continue core/hip stability as able    Consulted and Agree with Plan of Care  Patient       Patient will benefit from skilled therapeutic intervention in order to improve the following deficits and impairments:  Impaired flexibility, Decreased activity tolerance, Decreased range of motion, Pain  Visit Diagnosis: Chronic bilateral low back pain without sciatica  Stiffness of left hip, not elsewhere classified  Stiffness of right hip, not elsewhere classified     Problem List Patient Active Problem List   Diagnosis Date Noted  . Dyslipidemia 06/04/2014  . Essential hypertension, benign 05/15/2014      Laureen Abrahams, PT, DPT 12/13/17 2:54 PM    [facility] [address] [place], [place], [address] Phone: [phone]   Fax:  [phone]  Name: Mariah Irwin MRN: [id number] Date of Birth: [birth year]

## 2017-12-18 ENCOUNTER — Ambulatory Visit: Payer: BLUE CROSS/BLUE SHIELD | Attending: Family Medicine | Admitting: Physical Therapy

## 2017-12-18 ENCOUNTER — Encounter: Payer: Self-pay | Admitting: Physical Therapy

## 2017-12-18 DIAGNOSIS — M545 Low back pain: Secondary | ICD-10-CM | POA: Diagnosis not present

## 2017-12-18 DIAGNOSIS — M25651 Stiffness of right hip, not elsewhere classified: Secondary | ICD-10-CM | POA: Diagnosis not present

## 2017-12-18 DIAGNOSIS — G8929 Other chronic pain: Secondary | ICD-10-CM | POA: Insufficient documentation

## 2017-12-18 DIAGNOSIS — M25652 Stiffness of left hip, not elsewhere classified: Secondary | ICD-10-CM | POA: Insufficient documentation

## 2017-12-18 NOTE — Therapy (Signed)
[facility] [address] [place], [place], [address] Phone: [phone]   Fax:  [phone]  Physical Therapy Treatment  Patient Details  Name: Mariah Irwin MRN: [id number] Date of Birth: 1971-06-20 Referring Provider: Rachell Cipro, MD   Encounter Date: 12/18/2017  PT End of Session - 12/18/17 1106    Visit Number  7    Date for PT Re-Evaluation  01/15/18    Authorization Type  BCBS-20 visit limit    Authorization - Visit Number  7    Authorization - Number of Visits  20    PT Start Time  7026    PT Stop Time  1057    PT Time Calculation (min)  39 min    Activity Tolerance  Patient tolerated treatment well;No increased pain    Behavior During Therapy  WFL for tasks assessed/performed       Past Medical History:  Diagnosis Date  . Hypercholesteremia   . Hypertension     History reviewed. No pertinent surgical history.  There were no vitals filed for this visit.  Subjective Assessment - 12/18/17 1021    Subjective  went to the gym yesterday for the first time in weeks (squats, rows, core, mild cardio) and a little sore today but overall it felt better.  DN was helpful; no pain with housework    Patient Stated Goals  reduce pain, return to regular exercise, sit without limitation    Pain Score  0-No pain                       [facility] Adult PT Treatment/Exercise - 12/18/17 1022      Lumbar Exercises: Aerobic   Nustep  L3 x 6 min      Lumbar Exercises: Standing   Scapular Retraction  Power Tower;20 reps 35#    Shoulder Extension  Power Tower;20 reps 35#    Other Standing Lumbar Exercises  plie squats: with yellow med ball: trunk rotation x 10 bil; weight shifting x 10 bil      Lumbar Exercises: Supine   Pelvic Tilt  10 reps;5 seconds    Clam  10 reps alternating with TrA; blue theraband    Heel Slides  10 reps alternating with TrA    Bent Knee Raise  10 reps alternating with TrA;  blue theraband    Bridge  10 reps;5 seconds with strap for hip abduction      Lumbar Exercises: Quadruped   Plank  on elbows with alternating hip extension 2x10               PT Short Term Goals - 12/18/17 1107      PT SHORT TERM GOAL #1   Title  be independent in initial HEP    Status  Achieved      PT SHORT TERM GOAL #2   Title  report ability to perform housework without increased LBP    Time  4    Period  Weeks    Status  Achieved      PT SHORT TERM GOAL #3   Title  verbalize and demonstrate correct body mechanics modifications for lumbar protection with home actities and gym exercises    Baseline  pt verbalized proper form    Time  4    Period  Weeks    Status  Achieved        PT Long Term Goals - 11/20/17 1219  PT LONG TERM GOAL #1   Title  be independent in advanced HEP    Time  8    Period  Weeks    Status  New    Target Date  01/15/18      PT LONG TERM GOAL #2   Title  reduce FOTO to < or = to 23% limitation    Time  8    Period  Weeks    Status  New    Target Date  01/15/18      PT LONG TERM GOAL #3   Title  sit for meetings at work without limitation due to LBP    Time  8    Period  Weeks    Status  New    Target Date  01/15/18      PT LONG TERM GOAL #4   Title  perform reuglar gym exercises without significant modification due to LBP    Time  8    Period  Weeks    Status  New    Target Date  01/15/18      PT LONG TERM GOAL #5   Title  report 75% reduction in LBP with daily activities and housework    Time  8    Period  Weeks    Status  New    Target Date  01/15/18            Plan - 12/18/17 1107    Clinical Impression Statement  Pt continues to report no increase in pain with strengthening exercises today and returned to the gym for 1 session so far.  Progressing well and anticipate pt will be ready for d/c in next couple weeks if transition to community fitness goes well.    Rehab Potential  Good    PT Frequency  2x  / week    PT Duration  8 weeks    PT Treatment/Interventions  ADLs/Self Care Home Management;Cryotherapy;Electrical Stimulation;Moist Heat;Traction;Ultrasound;Therapeutic exercise;Neuromuscular re-education;Manual techniques;Passive range of motion;Dry needling;Taping;Functional mobility training;Therapeutic activities;Patient/family education    PT Next Visit Plan  review HEP, assess response to dry needling to lumbar multifidi and bil proximal gluteals with soft tissue work to address trigger points, continue core/hip stability as able    Consulted and Agree with Plan of Care  Patient       Patient will benefit from skilled therapeutic intervention in order to improve the following deficits and impairments:  Impaired flexibility, Decreased activity tolerance, Decreased range of motion, Pain  Visit Diagnosis: Chronic bilateral low back pain without sciatica  Stiffness of left hip, not elsewhere classified  Stiffness of right hip, not elsewhere classified     Problem List Patient Active Problem List   Diagnosis Date Noted  . Dyslipidemia 06/04/2014  . Essential hypertension, benign 05/15/2014      Laureen Abrahams, PT, DPT 12/18/17 11:11 AM     [facility] [address] [place], [place], [address] Phone: [phone]   Fax:  [phone]  Name: Mariah Irwin MRN: [id number] Date of Birth: 12/01/1970

## 2017-12-20 ENCOUNTER — Encounter: Payer: BLUE CROSS/BLUE SHIELD | Admitting: Physical Therapy

## 2017-12-25 ENCOUNTER — Ambulatory Visit: Payer: BLUE CROSS/BLUE SHIELD

## 2017-12-25 DIAGNOSIS — M25652 Stiffness of left hip, not elsewhere classified: Secondary | ICD-10-CM

## 2017-12-25 DIAGNOSIS — M25651 Stiffness of right hip, not elsewhere classified: Secondary | ICD-10-CM | POA: Diagnosis not present

## 2017-12-25 DIAGNOSIS — G8929 Other chronic pain: Secondary | ICD-10-CM | POA: Diagnosis not present

## 2017-12-25 DIAGNOSIS — M545 Low back pain: Principal | ICD-10-CM

## 2017-12-25 NOTE — Therapy (Signed)
[facility] [address] [place], [place], [address] Phone: [phone]   Fax:  [phone]  Physical Therapy Treatment  Patient Details  Name: Mariah Irwin MRN: [id number] Date of Birth: 27-Oct-1970 Referring Provider: Rachell Cipro, MD   Encounter Date: 12/25/2017  PT End of Session - 12/25/17 1207    Visit Number  8    Date for PT Re-Evaluation  01/15/18    Authorization Type  BCBS-20 visit limit    Authorization - Visit Number  8    Authorization - Number of Visits  20    PT Start Time  6010    PT Stop Time  9323    PT Time Calculation (min)  54 min    Activity Tolerance  Patient tolerated treatment well;No increased pain    Behavior During Therapy  WFL for tasks assessed/performed       Past Medical History:  Diagnosis Date  . Hypercholesteremia   . Hypertension     History reviewed. No pertinent surgical history.  There were no vitals filed for this visit.  Subjective Assessment - 12/25/17 1105    Subjective  I am feeling tighter in bil lumbar spine and Rt hamstring insertion. I feel >90% better.      Currently in Pain?  Yes    Pain Score  0-No pain    Pain Location  Back    Pain Orientation  Lower    Pain Descriptors / Indicators  Tightness                       [facility] Adult PT Treatment/Exercise - 12/25/17 0001      Moist Heat Therapy   Number Minutes Moist Heat  10 Minutes    Moist Heat Location  Lumbar Spine      Manual Therapy   Manual Therapy  Soft tissue mobilization;Myofascial release    Manual therapy comments  skilled palpation and assessment during dry needling     Soft tissue mobilization  soft tissue elongation to Rt gluteals, bil lumbar paraspinals and Rt hamstrings.       Trigger Point Dry Needling - 12/25/17 1105    Consent Given?  Yes    Muscles Treated Upper Body  Hamstring Rt    Muscles Treated Lower Body  Gluteus minimus;Gluteus maximus    Gluteus  Maximus Response  Twitch response elicited;Palpable increased muscle length    Gluteus Minimus Response  Twitch response elicited;Palpable increased muscle length             PT Short Term Goals - 12/18/17 1107      PT SHORT TERM GOAL #1   Title  be independent in initial HEP    Status  Achieved      PT SHORT TERM GOAL #2   Title  report ability to perform housework without increased LBP    Time  4    Period  Weeks    Status  Achieved      PT SHORT TERM GOAL #3   Title  verbalize and demonstrate correct body mechanics modifications for lumbar protection with home actities and gym exercises    Baseline  pt verbalized proper form    Time  4    Period  Weeks    Status  Achieved        PT Long Term Goals - 12/25/17 1106      PT LONG TERM GOAL #1   Title  be  independent in advanced HEP    Time  8    Period  Weeks    Status  On-going      PT LONG TERM GOAL #3   Title  sit for meetings at work without limitation due to LBP    Status  Achieved      PT LONG TERM GOAL #4   Title  perform reuglar gym exercises without significant modification due to LBP    Baseline  still modifying- not doing as many days, limited weight    Time  8    Period  Weeks    Status  On-going      PT LONG TERM GOAL #5   Status  Achieved            Plan - 12/25/17 1144    Clinical Impression Statement  Pt reports 90% overall improvement in symptoms since the start of care.  Pt with reports of tension and tightness in Rt gluteals, hamstrings and bil low back.  Session focued on dry needling to address remaining tightness.  Pt with trigger points in Rt medial hamstring insertion and Rt gluteals.  pt demosntrated improved tissue mobility and reduced stiffness after treatment.  Pt is exercising at the gym and reports reduced frequency and lower weights.  PT encouraged pt to gradually increase this as able.  Pt will continue to benefit from skilled PT for gym exercises, strength, flexibility and  manual as needed.      Rehab Potential  Good    PT Frequency  2x / week    PT Duration  8 weeks    PT Treatment/Interventions  ADLs/Self Care Home Management;Cryotherapy;Electrical Stimulation;Moist Heat;Traction;Ultrasound;Therapeutic exercise;Neuromuscular re-education;Manual techniques;Passive range of motion;Dry needling;Taping;Functional mobility training;Therapeutic activities;Patient/family education    PT Next Visit Plan  review HEP, assess response to dry needling to lumbar multifidi and bil proximal gluteals with soft tissue work to address trigger points, continue core/hip stability as able    Consulted and Agree with Plan of Care  Patient       Patient will benefit from skilled therapeutic intervention in order to improve the following deficits and impairments:  Impaired flexibility, Decreased activity tolerance, Decreased range of motion, Pain  Visit Diagnosis: Chronic bilateral low back pain without sciatica  Stiffness of left hip, not elsewhere classified  Stiffness of right hip, not elsewhere classified     Problem List Patient Active Problem List   Diagnosis Date Noted  . Dyslipidemia 06/04/2014  . Essential hypertension, benign 05/15/2014     Sigurd Sos, PT 12/25/17 12:09 PM  [facility] [address] [place], [place], [address] Phone: [phone]   Fax:  [phone]  Name: DAKIA SCHIFANO MRN: [id number] Date of Birth: 06/24/1971

## 2017-12-27 ENCOUNTER — Encounter: Payer: BLUE CROSS/BLUE SHIELD | Admitting: Physical Therapy

## 2018-01-01 ENCOUNTER — Ambulatory Visit: Payer: BLUE CROSS/BLUE SHIELD

## 2018-01-01 DIAGNOSIS — G8929 Other chronic pain: Secondary | ICD-10-CM

## 2018-01-01 DIAGNOSIS — M25651 Stiffness of right hip, not elsewhere classified: Secondary | ICD-10-CM

## 2018-01-01 DIAGNOSIS — M25652 Stiffness of left hip, not elsewhere classified: Secondary | ICD-10-CM

## 2018-01-01 DIAGNOSIS — M545 Low back pain: Principal | ICD-10-CM

## 2018-01-01 NOTE — Therapy (Signed)
[facility] [address] [place], [place], [address] Phone: [phone]   Fax:  [phone]  Physical Therapy Treatment  Patient Details  Name: Mariah Irwin MRN: [id number] Date of Birth: 08-Jul-1971 Referring Provider: Rachell Cipro, MD   Encounter Date: 01/01/2018  PT End of Session - 01/01/18 1107    Visit Number  9    Authorization Type  BCBS-20 visit limit    Authorization - Visit Number  9    Authorization - Number of Visits  20    PT Start Time  1016    PT Stop Time  1113    PT Time Calculation (min)  57 min    Activity Tolerance  Patient tolerated treatment well;No increased pain    Behavior During Therapy  WFL for tasks assessed/performed       Past Medical History:  Diagnosis Date  . Hypercholesteremia   . Hypertension     History reviewed. No pertinent surgical history.  There were no vitals filed for this visit.  Subjective Assessment - 01/01/18 1019    Subjective  I went to yoga and my back was hurting when I went into extension.  I am ready for D/C to HEP.  I have good days and bad days.      Currently in Pain?  Yes    Pain Score  2     Pain Location  Back    Pain Orientation  Lower;Right    Pain Descriptors / Indicators  Tightness    Pain Type  Chronic pain    Pain Onset  More than a month ago    Pain Frequency  Constant    Aggravating Factors   extension, bending over, housework    Pain Relieving Factors  stretching, dry needling         [facility] PT Assessment - 01/01/18 0001      Assessment   Medical Diagnosis  back pain       Prior Function   Level of Independence  Independent    Vocation  Full time employment    Vocation Requirements  sitting and standing: professor    Leisure  exercise, hiking,  reading      Cognition   Overall Cognitive Status  Within Functional Limits for tasks assessed      Observation/Other Assessments   Focus on Therapeutic Outcomes (FOTO)   29%  limitation      Posture/Postural Control   Posture/Postural Control  Postural limitations    Postural Limitations  Decreased lumbar lordosis                   [facility] Adult PT Treatment/Exercise - 01/01/18 0001      Moist Heat Therapy   Number Minutes Moist Heat  10 Minutes    Moist Heat Location  Lumbar Spine      Manual Therapy   Manual Therapy  Soft tissue mobilization;Myofascial release    Manual therapy comments  skilled palpation and assessment during dry needling     Soft tissue mobilization  soft tissue elongation to Rt gluteals, bil lumbar paraspinals and Rt hamstrings.       Trigger Point Dry Needling - 01/01/18 1033    Consent Given?  Yes    Muscles Treated Upper Body  Hamstring Rt hamstring, bil lumbar multifidi    Muscles Treated Lower Body  Gluteus minimus;Gluteus maximus    Gluteus Maximus Response  Twitch response elicited;Palpable increased muscle length  Gluteus Minimus Response  Twitch response elicited;Palpable increased muscle length             PT Short Term Goals - 12/18/17 1107      PT SHORT TERM GOAL #1   Title  be independent in initial HEP    Status  Achieved      PT SHORT TERM GOAL #2   Title  report ability to perform housework without increased LBP    Time  4    Period  Weeks    Status  Achieved      PT SHORT TERM GOAL #3   Title  verbalize and demonstrate correct body mechanics modifications for lumbar protection with home actities and gym exercises    Baseline  pt verbalized proper form    Time  4    Period  Weeks    Status  Achieved        PT Long Term Goals - 01/01/18 1023      PT LONG TERM GOAL #1   Title  be independent in advanced HEP    Status  Achieved      PT LONG TERM GOAL #3   Title  sit for meetings at work without limitation due to LBP    Status  Achieved      PT LONG TERM GOAL #4   Title  perform reuglar gym exercises without significant modification due to LBP    Baseline  still modifying-  not doing as many days, limited weight    Status  Partially Met      PT LONG TERM GOAL #5   Title  report 75% reduction in LBP with daily activities and housework    Baseline  90%    Status  Achieved            Plan - 01/01/18 1031    Clinical Impression Statement  Pt reports 90% overall improvement in symptoms since the start of care.  Pt with continued intermittent Rt sided lumbar and gluteal pain but this is improved.  Pt is working to return to regular exercise and activity and has HEP in place.  Pt with trigger points in Rt medial hamstring insertion and Rt gluteals.  Pt with improved tissue mobility and reduced stiffness after treatment.  Pt will D/C to HEP today.      PT Next Visit Plan  D/C PT to HEP    Consulted and Agree with Plan of Care  Patient       Patient will benefit from skilled therapeutic intervention in order to improve the following deficits and impairments:     Visit Diagnosis: Chronic bilateral low back pain without sciatica  Stiffness of left hip, not elsewhere classified  Stiffness of right hip, not elsewhere classified     Problem List Patient Active Problem List   Diagnosis Date Noted  . Dyslipidemia 06/04/2014  . Essential hypertension, benign 05/15/2014   PHYSICAL THERAPY DISCHARGE SUMMARY  Visits from Start of Care: 9  Current functional level related to goals / functional outcomes: See above for current status.     Remaining deficits: Pain in low back, Rt gluteals and Rt hamstrings that is intermittent.  Pt has HEP in place and instructions on how to slowly advance her gym exercise program.     Education / Equipment: HEP Plan: Patient agrees to discharge.  Patient goals were partially met. Patient is being discharged due to being pleased with the current functional level.  ?????  Kelly Takacs, PT 01/01/18 11:09 AM  [facility] [address] [place], [place], [address] Phone: [phone]   Fax:  [phone]  Name: Mariah Irwin MRN: [id number] Date of Birth: 12/08/1970   

## 2018-01-03 ENCOUNTER — Encounter: Payer: BLUE CROSS/BLUE SHIELD | Admitting: Physical Therapy

## 2018-01-15 DIAGNOSIS — Z13220 Encounter for screening for lipoid disorders: Secondary | ICD-10-CM | POA: Diagnosis not present

## 2018-01-15 DIAGNOSIS — I10 Essential (primary) hypertension: Secondary | ICD-10-CM | POA: Diagnosis not present

## 2018-01-18 DIAGNOSIS — I10 Essential (primary) hypertension: Secondary | ICD-10-CM | POA: Diagnosis not present

## 2018-01-18 DIAGNOSIS — Z6827 Body mass index (BMI) 27.0-27.9, adult: Secondary | ICD-10-CM | POA: Diagnosis not present

## 2018-01-18 DIAGNOSIS — M549 Dorsalgia, unspecified: Secondary | ICD-10-CM | POA: Diagnosis not present

## 2018-01-18 DIAGNOSIS — E782 Mixed hyperlipidemia: Secondary | ICD-10-CM | POA: Diagnosis not present

## 2018-06-17 DIAGNOSIS — Z23 Encounter for immunization: Secondary | ICD-10-CM | POA: Diagnosis not present

## 2018-07-22 DIAGNOSIS — Z23 Encounter for immunization: Secondary | ICD-10-CM | POA: Diagnosis not present

## 2018-08-21 DIAGNOSIS — Z0000 Encounter for general adult medical examination without abnormal findings: Secondary | ICD-10-CM | POA: Diagnosis not present

## 2018-08-21 DIAGNOSIS — Z13220 Encounter for screening for lipoid disorders: Secondary | ICD-10-CM | POA: Diagnosis not present

## 2018-08-21 DIAGNOSIS — Z114 Encounter for screening for human immunodeficiency virus [HIV]: Secondary | ICD-10-CM | POA: Diagnosis not present

## 2018-08-21 DIAGNOSIS — Z1329 Encounter for screening for other suspected endocrine disorder: Secondary | ICD-10-CM | POA: Diagnosis not present

## 2018-08-22 DIAGNOSIS — Z6827 Body mass index (BMI) 27.0-27.9, adult: Secondary | ICD-10-CM | POA: Diagnosis not present

## 2018-08-22 DIAGNOSIS — Z0000 Encounter for general adult medical examination without abnormal findings: Secondary | ICD-10-CM | POA: Diagnosis not present

## 2018-08-23 ENCOUNTER — Other Ambulatory Visit: Payer: Self-pay | Admitting: Family Medicine

## 2018-08-23 DIAGNOSIS — Z1231 Encounter for screening mammogram for malignant neoplasm of breast: Secondary | ICD-10-CM

## 2018-11-19 DIAGNOSIS — Z6827 Body mass index (BMI) 27.0-27.9, adult: Secondary | ICD-10-CM | POA: Diagnosis not present

## 2018-11-19 DIAGNOSIS — N952 Postmenopausal atrophic vaginitis: Secondary | ICD-10-CM | POA: Diagnosis not present

## 2018-11-19 DIAGNOSIS — R300 Dysuria: Secondary | ICD-10-CM | POA: Diagnosis not present

## 2018-11-19 DIAGNOSIS — F339 Major depressive disorder, recurrent, unspecified: Secondary | ICD-10-CM | POA: Diagnosis not present

## 2018-12-03 ENCOUNTER — Ambulatory Visit: Payer: BLUE CROSS/BLUE SHIELD

## 2018-12-05 DIAGNOSIS — F331 Major depressive disorder, recurrent, moderate: Secondary | ICD-10-CM | POA: Diagnosis not present

## 2018-12-05 DIAGNOSIS — F411 Generalized anxiety disorder: Secondary | ICD-10-CM | POA: Diagnosis not present

## 2018-12-05 DIAGNOSIS — N952 Postmenopausal atrophic vaginitis: Secondary | ICD-10-CM | POA: Diagnosis not present

## 2018-12-05 DIAGNOSIS — N941 Unspecified dyspareunia: Secondary | ICD-10-CM | POA: Diagnosis not present

## 2018-12-30 ENCOUNTER — Ambulatory Visit: Payer: BLUE CROSS/BLUE SHIELD

## 2019-01-09 DIAGNOSIS — F4323 Adjustment disorder with mixed anxiety and depressed mood: Secondary | ICD-10-CM | POA: Diagnosis not present

## 2019-01-09 DIAGNOSIS — N952 Postmenopausal atrophic vaginitis: Secondary | ICD-10-CM | POA: Diagnosis not present

## 2019-01-09 DIAGNOSIS — F411 Generalized anxiety disorder: Secondary | ICD-10-CM | POA: Diagnosis not present

## 2019-01-09 DIAGNOSIS — Z719 Counseling, unspecified: Secondary | ICD-10-CM | POA: Diagnosis not present

## 2019-01-09 DIAGNOSIS — F331 Major depressive disorder, recurrent, moderate: Secondary | ICD-10-CM | POA: Diagnosis not present

## 2019-01-23 DIAGNOSIS — F4321 Adjustment disorder with depressed mood: Secondary | ICD-10-CM | POA: Diagnosis not present

## 2019-02-20 DIAGNOSIS — F4321 Adjustment disorder with depressed mood: Secondary | ICD-10-CM | POA: Diagnosis not present

## 2019-02-26 DIAGNOSIS — M1711 Unilateral primary osteoarthritis, right knee: Secondary | ICD-10-CM | POA: Diagnosis not present

## 2019-02-27 DIAGNOSIS — F331 Major depressive disorder, recurrent, moderate: Secondary | ICD-10-CM | POA: Diagnosis not present

## 2019-02-27 DIAGNOSIS — F411 Generalized anxiety disorder: Secondary | ICD-10-CM | POA: Diagnosis not present

## 2019-02-27 DIAGNOSIS — N952 Postmenopausal atrophic vaginitis: Secondary | ICD-10-CM | POA: Diagnosis not present

## 2019-04-18 DIAGNOSIS — M222X2 Patellofemoral disorders, left knee: Secondary | ICD-10-CM | POA: Diagnosis not present

## 2019-04-18 DIAGNOSIS — M1711 Unilateral primary osteoarthritis, right knee: Secondary | ICD-10-CM | POA: Diagnosis not present

## 2019-04-18 DIAGNOSIS — M25561 Pain in right knee: Secondary | ICD-10-CM | POA: Diagnosis not present

## 2019-04-25 DIAGNOSIS — M1711 Unilateral primary osteoarthritis, right knee: Secondary | ICD-10-CM | POA: Diagnosis not present

## 2019-04-25 DIAGNOSIS — Z20828 Contact with and (suspected) exposure to other viral communicable diseases: Secondary | ICD-10-CM | POA: Diagnosis not present

## 2019-04-25 DIAGNOSIS — Z7189 Other specified counseling: Secondary | ICD-10-CM | POA: Diagnosis not present

## 2019-05-02 DIAGNOSIS — M1711 Unilateral primary osteoarthritis, right knee: Secondary | ICD-10-CM | POA: Diagnosis not present

## 2019-05-27 DIAGNOSIS — M65311 Trigger thumb, right thumb: Secondary | ICD-10-CM | POA: Diagnosis not present

## 2019-05-27 DIAGNOSIS — M65321 Trigger finger, right index finger: Secondary | ICD-10-CM | POA: Diagnosis not present

## 2019-05-27 DIAGNOSIS — M79641 Pain in right hand: Secondary | ICD-10-CM | POA: Diagnosis not present

## 2019-06-12 DIAGNOSIS — Z23 Encounter for immunization: Secondary | ICD-10-CM | POA: Diagnosis not present

## 2019-06-13 DIAGNOSIS — Z7189 Other specified counseling: Secondary | ICD-10-CM | POA: Diagnosis not present

## 2019-06-13 DIAGNOSIS — Z20828 Contact with and (suspected) exposure to other viral communicable diseases: Secondary | ICD-10-CM | POA: Diagnosis not present

## 2019-07-03 DIAGNOSIS — Z20828 Contact with and (suspected) exposure to other viral communicable diseases: Secondary | ICD-10-CM | POA: Diagnosis not present

## 2019-07-03 DIAGNOSIS — Z7189 Other specified counseling: Secondary | ICD-10-CM | POA: Diagnosis not present

## 2019-07-03 DIAGNOSIS — Z03818 Encounter for observation for suspected exposure to other biological agents ruled out: Secondary | ICD-10-CM | POA: Diagnosis not present

## 2019-07-14 DIAGNOSIS — I10 Essential (primary) hypertension: Secondary | ICD-10-CM | POA: Diagnosis not present

## 2019-07-14 DIAGNOSIS — N952 Postmenopausal atrophic vaginitis: Secondary | ICD-10-CM | POA: Diagnosis not present

## 2019-07-14 DIAGNOSIS — F331 Major depressive disorder, recurrent, moderate: Secondary | ICD-10-CM | POA: Diagnosis not present

## 2019-07-14 DIAGNOSIS — K219 Gastro-esophageal reflux disease without esophagitis: Secondary | ICD-10-CM | POA: Diagnosis not present

## 2019-07-16 ENCOUNTER — Other Ambulatory Visit: Payer: Self-pay | Admitting: Family Medicine

## 2019-07-16 DIAGNOSIS — R110 Nausea: Secondary | ICD-10-CM

## 2019-07-16 DIAGNOSIS — R109 Unspecified abdominal pain: Secondary | ICD-10-CM

## 2019-07-18 ENCOUNTER — Ambulatory Visit
Admission: RE | Admit: 2019-07-18 | Discharge: 2019-07-18 | Disposition: A | Discharge status: Home / Self Care | Payer: BC Managed Care – PPO | Source: Ambulatory Visit | Attending: Family Medicine | Admitting: Family Medicine

## 2019-07-18 DIAGNOSIS — R110 Nausea: Secondary | ICD-10-CM

## 2019-07-18 DIAGNOSIS — R109 Unspecified abdominal pain: Secondary | ICD-10-CM | POA: Diagnosis not present

## 2019-08-21 DIAGNOSIS — I10 Essential (primary) hypertension: Secondary | ICD-10-CM | POA: Diagnosis not present

## 2019-08-21 DIAGNOSIS — R110 Nausea: Secondary | ICD-10-CM | POA: Diagnosis not present

## 2019-08-21 DIAGNOSIS — Z0000 Encounter for general adult medical examination without abnormal findings: Secondary | ICD-10-CM | POA: Diagnosis not present

## 2019-08-21 DIAGNOSIS — R109 Unspecified abdominal pain: Secondary | ICD-10-CM | POA: Diagnosis not present

## 2019-08-22 DIAGNOSIS — L682 Localized hypertrichosis: Secondary | ICD-10-CM | POA: Diagnosis not present

## 2019-08-22 DIAGNOSIS — I10 Essential (primary) hypertension: Secondary | ICD-10-CM | POA: Diagnosis not present

## 2019-08-22 DIAGNOSIS — K219 Gastro-esophageal reflux disease without esophagitis: Secondary | ICD-10-CM | POA: Diagnosis not present

## 2019-08-22 DIAGNOSIS — E876 Hypokalemia: Secondary | ICD-10-CM | POA: Diagnosis not present

## 2019-09-01 DIAGNOSIS — Z1159 Encounter for screening for other viral diseases: Secondary | ICD-10-CM | POA: Diagnosis not present

## 2019-09-04 DIAGNOSIS — Z23 Encounter for immunization: Secondary | ICD-10-CM | POA: Diagnosis not present

## 2019-09-04 DIAGNOSIS — Z1211 Encounter for screening for malignant neoplasm of colon: Secondary | ICD-10-CM | POA: Diagnosis not present

## 2019-09-04 DIAGNOSIS — Z0000 Encounter for general adult medical examination without abnormal findings: Secondary | ICD-10-CM | POA: Diagnosis not present

## 2019-09-08 DIAGNOSIS — F411 Generalized anxiety disorder: Secondary | ICD-10-CM | POA: Diagnosis not present

## 2019-09-08 DIAGNOSIS — E782 Mixed hyperlipidemia: Secondary | ICD-10-CM | POA: Diagnosis not present

## 2019-09-08 DIAGNOSIS — I10 Essential (primary) hypertension: Secondary | ICD-10-CM | POA: Diagnosis not present

## 2019-09-08 DIAGNOSIS — E876 Hypokalemia: Secondary | ICD-10-CM | POA: Diagnosis not present

## 2019-09-22 ENCOUNTER — Other Ambulatory Visit: Payer: Self-pay | Admitting: Family Medicine

## 2019-09-22 DIAGNOSIS — Z1231 Encounter for screening mammogram for malignant neoplasm of breast: Secondary | ICD-10-CM

## 2019-10-01 DIAGNOSIS — E876 Hypokalemia: Secondary | ICD-10-CM | POA: Diagnosis not present

## 2019-10-01 DIAGNOSIS — K219 Gastro-esophageal reflux disease without esophagitis: Secondary | ICD-10-CM | POA: Diagnosis not present

## 2019-10-03 DIAGNOSIS — K219 Gastro-esophageal reflux disease without esophagitis: Secondary | ICD-10-CM | POA: Diagnosis not present

## 2019-10-03 DIAGNOSIS — L682 Localized hypertrichosis: Secondary | ICD-10-CM | POA: Diagnosis not present

## 2019-10-03 DIAGNOSIS — E876 Hypokalemia: Secondary | ICD-10-CM | POA: Diagnosis not present

## 2019-10-20 DIAGNOSIS — Z20828 Contact with and (suspected) exposure to other viral communicable diseases: Secondary | ICD-10-CM | POA: Diagnosis not present

## 2019-10-23 DIAGNOSIS — H6121 Impacted cerumen, right ear: Secondary | ICD-10-CM | POA: Diagnosis not present

## 2019-10-23 DIAGNOSIS — F411 Generalized anxiety disorder: Secondary | ICD-10-CM | POA: Diagnosis not present

## 2019-10-23 DIAGNOSIS — I83812 Varicose veins of left lower extremities with pain: Secondary | ICD-10-CM | POA: Diagnosis not present

## 2019-10-23 DIAGNOSIS — M1711 Unilateral primary osteoarthritis, right knee: Secondary | ICD-10-CM | POA: Diagnosis not present

## 2019-10-23 DIAGNOSIS — K219 Gastro-esophageal reflux disease without esophagitis: Secondary | ICD-10-CM | POA: Diagnosis not present

## 2019-10-29 ENCOUNTER — Ambulatory Visit
Admission: RE | Admit: 2019-10-29 | Discharge: 2019-10-29 | Disposition: A | Discharge status: Home / Self Care | Payer: BC Managed Care – PPO | Source: Ambulatory Visit | Attending: Family Medicine | Admitting: Family Medicine

## 2019-10-29 ENCOUNTER — Other Ambulatory Visit: Payer: Self-pay

## 2019-10-29 DIAGNOSIS — Z1231 Encounter for screening mammogram for malignant neoplasm of breast: Secondary | ICD-10-CM | POA: Diagnosis not present

## 2019-12-09 DIAGNOSIS — Z20828 Contact with and (suspected) exposure to other viral communicable diseases: Secondary | ICD-10-CM | POA: Diagnosis not present

## 2019-12-11 DIAGNOSIS — R300 Dysuria: Secondary | ICD-10-CM | POA: Diagnosis not present

## 2019-12-11 DIAGNOSIS — M170 Bilateral primary osteoarthritis of knee: Secondary | ICD-10-CM | POA: Diagnosis not present

## 2019-12-11 DIAGNOSIS — I83812 Varicose veins of left lower extremities with pain: Secondary | ICD-10-CM | POA: Diagnosis not present

## 2019-12-11 DIAGNOSIS — M7541 Impingement syndrome of right shoulder: Secondary | ICD-10-CM | POA: Diagnosis not present

## 2019-12-11 DIAGNOSIS — N952 Postmenopausal atrophic vaginitis: Secondary | ICD-10-CM | POA: Diagnosis not present

## 2019-12-11 DIAGNOSIS — K219 Gastro-esophageal reflux disease without esophagitis: Secondary | ICD-10-CM | POA: Diagnosis not present

## 2019-12-23 DIAGNOSIS — M170 Bilateral primary osteoarthritis of knee: Secondary | ICD-10-CM | POA: Diagnosis not present

## 2020-01-01 DIAGNOSIS — N952 Postmenopausal atrophic vaginitis: Secondary | ICD-10-CM | POA: Diagnosis not present

## 2020-01-01 DIAGNOSIS — M25511 Pain in right shoulder: Secondary | ICD-10-CM | POA: Diagnosis not present

## 2020-01-01 DIAGNOSIS — L309 Dermatitis, unspecified: Secondary | ICD-10-CM | POA: Diagnosis not present

## 2020-01-01 DIAGNOSIS — M170 Bilateral primary osteoarthritis of knee: Secondary | ICD-10-CM | POA: Diagnosis not present

## 2020-01-20 DIAGNOSIS — L309 Dermatitis, unspecified: Secondary | ICD-10-CM | POA: Diagnosis not present

## 2020-01-29 DIAGNOSIS — F411 Generalized anxiety disorder: Secondary | ICD-10-CM | POA: Diagnosis not present

## 2020-01-29 DIAGNOSIS — L989 Disorder of the skin and subcutaneous tissue, unspecified: Secondary | ICD-10-CM | POA: Diagnosis not present

## 2020-02-12 DIAGNOSIS — L989 Disorder of the skin and subcutaneous tissue, unspecified: Secondary | ICD-10-CM | POA: Diagnosis not present

## 2020-04-23 DIAGNOSIS — K219 Gastro-esophageal reflux disease without esophagitis: Secondary | ICD-10-CM | POA: Diagnosis not present

## 2020-04-23 DIAGNOSIS — M549 Dorsalgia, unspecified: Secondary | ICD-10-CM | POA: Diagnosis not present

## 2020-04-23 DIAGNOSIS — E876 Hypokalemia: Secondary | ICD-10-CM | POA: Diagnosis not present

## 2020-04-23 DIAGNOSIS — M1990 Unspecified osteoarthritis, unspecified site: Secondary | ICD-10-CM | POA: Diagnosis not present

## 2020-04-28 DIAGNOSIS — M170 Bilateral primary osteoarthritis of knee: Secondary | ICD-10-CM | POA: Diagnosis not present

## 2020-05-04 DIAGNOSIS — N3000 Acute cystitis without hematuria: Secondary | ICD-10-CM | POA: Diagnosis not present

## 2020-06-21 DIAGNOSIS — Z03818 Encounter for observation for suspected exposure to other biological agents ruled out: Secondary | ICD-10-CM | POA: Diagnosis not present

## 2020-07-16 DIAGNOSIS — Z03818 Encounter for observation for suspected exposure to other biological agents ruled out: Secondary | ICD-10-CM | POA: Diagnosis not present

## 2020-07-28 DIAGNOSIS — M25552 Pain in left hip: Secondary | ICD-10-CM | POA: Diagnosis not present

## 2020-07-28 DIAGNOSIS — M25551 Pain in right hip: Secondary | ICD-10-CM | POA: Diagnosis not present

## 2020-07-28 DIAGNOSIS — M25561 Pain in right knee: Secondary | ICD-10-CM | POA: Diagnosis not present

## 2020-07-28 DIAGNOSIS — M25562 Pain in left knee: Secondary | ICD-10-CM | POA: Diagnosis not present

## 2020-08-03 DIAGNOSIS — M25551 Pain in right hip: Secondary | ICD-10-CM | POA: Diagnosis not present

## 2020-08-03 DIAGNOSIS — M25562 Pain in left knee: Secondary | ICD-10-CM | POA: Diagnosis not present

## 2020-08-03 DIAGNOSIS — M25552 Pain in left hip: Secondary | ICD-10-CM | POA: Diagnosis not present

## 2020-08-03 DIAGNOSIS — M25561 Pain in right knee: Secondary | ICD-10-CM | POA: Diagnosis not present

## 2020-08-05 DIAGNOSIS — M25561 Pain in right knee: Secondary | ICD-10-CM | POA: Diagnosis not present

## 2020-08-05 DIAGNOSIS — M25551 Pain in right hip: Secondary | ICD-10-CM | POA: Diagnosis not present

## 2020-08-05 DIAGNOSIS — M25552 Pain in left hip: Secondary | ICD-10-CM | POA: Diagnosis not present

## 2020-08-05 DIAGNOSIS — M25562 Pain in left knee: Secondary | ICD-10-CM | POA: Diagnosis not present

## 2020-08-10 DIAGNOSIS — M25552 Pain in left hip: Secondary | ICD-10-CM | POA: Diagnosis not present

## 2020-08-10 DIAGNOSIS — M25561 Pain in right knee: Secondary | ICD-10-CM | POA: Diagnosis not present

## 2020-08-10 DIAGNOSIS — M25551 Pain in right hip: Secondary | ICD-10-CM | POA: Diagnosis not present

## 2020-08-10 DIAGNOSIS — M25562 Pain in left knee: Secondary | ICD-10-CM | POA: Diagnosis not present

## 2020-08-17 DIAGNOSIS — M25552 Pain in left hip: Secondary | ICD-10-CM | POA: Diagnosis not present

## 2020-08-17 DIAGNOSIS — M25562 Pain in left knee: Secondary | ICD-10-CM | POA: Diagnosis not present

## 2020-08-17 DIAGNOSIS — M25551 Pain in right hip: Secondary | ICD-10-CM | POA: Diagnosis not present

## 2020-08-17 DIAGNOSIS — M25561 Pain in right knee: Secondary | ICD-10-CM | POA: Diagnosis not present

## 2020-08-19 DIAGNOSIS — M25551 Pain in right hip: Secondary | ICD-10-CM | POA: Diagnosis not present

## 2020-08-19 DIAGNOSIS — M25562 Pain in left knee: Secondary | ICD-10-CM | POA: Diagnosis not present

## 2020-08-19 DIAGNOSIS — M25561 Pain in right knee: Secondary | ICD-10-CM | POA: Diagnosis not present

## 2020-08-19 DIAGNOSIS — M25552 Pain in left hip: Secondary | ICD-10-CM | POA: Diagnosis not present

## 2020-08-23 DIAGNOSIS — M549 Dorsalgia, unspecified: Secondary | ICD-10-CM | POA: Diagnosis not present

## 2020-08-23 DIAGNOSIS — Z818 Family history of other mental and behavioral disorders: Secondary | ICD-10-CM | POA: Diagnosis not present

## 2020-08-23 DIAGNOSIS — M1990 Unspecified osteoarthritis, unspecified site: Secondary | ICD-10-CM | POA: Diagnosis not present

## 2020-08-24 DIAGNOSIS — M25552 Pain in left hip: Secondary | ICD-10-CM | POA: Diagnosis not present

## 2020-08-24 DIAGNOSIS — M25562 Pain in left knee: Secondary | ICD-10-CM | POA: Diagnosis not present

## 2020-08-24 DIAGNOSIS — M25561 Pain in right knee: Secondary | ICD-10-CM | POA: Diagnosis not present

## 2020-08-24 DIAGNOSIS — M25551 Pain in right hip: Secondary | ICD-10-CM | POA: Diagnosis not present

## 2020-08-26 DIAGNOSIS — M25561 Pain in right knee: Secondary | ICD-10-CM | POA: Diagnosis not present

## 2020-08-26 DIAGNOSIS — M25562 Pain in left knee: Secondary | ICD-10-CM | POA: Diagnosis not present

## 2020-08-26 DIAGNOSIS — M25551 Pain in right hip: Secondary | ICD-10-CM | POA: Diagnosis not present

## 2020-08-26 DIAGNOSIS — M25552 Pain in left hip: Secondary | ICD-10-CM | POA: Diagnosis not present

## 2020-09-02 DIAGNOSIS — E876 Hypokalemia: Secondary | ICD-10-CM | POA: Diagnosis not present

## 2020-09-02 DIAGNOSIS — M1711 Unilateral primary osteoarthritis, right knee: Secondary | ICD-10-CM | POA: Diagnosis not present

## 2020-09-02 DIAGNOSIS — E782 Mixed hyperlipidemia: Secondary | ICD-10-CM | POA: Diagnosis not present

## 2020-09-02 DIAGNOSIS — M1712 Unilateral primary osteoarthritis, left knee: Secondary | ICD-10-CM | POA: Diagnosis not present

## 2020-09-02 DIAGNOSIS — M170 Bilateral primary osteoarthritis of knee: Secondary | ICD-10-CM | POA: Diagnosis not present

## 2020-09-02 DIAGNOSIS — I10 Essential (primary) hypertension: Secondary | ICD-10-CM | POA: Diagnosis not present

## 2020-09-03 ENCOUNTER — Ambulatory Visit
Admission: RE | Admit: 2020-09-03 | Discharge: 2020-09-03 | Disposition: A | Discharge status: Home / Self Care | Payer: BC Managed Care – PPO | Source: Ambulatory Visit | Attending: Family Medicine | Admitting: Family Medicine

## 2020-09-03 ENCOUNTER — Other Ambulatory Visit: Payer: Self-pay | Admitting: Family Medicine

## 2020-09-03 DIAGNOSIS — M25561 Pain in right knee: Secondary | ICD-10-CM

## 2020-09-03 DIAGNOSIS — M1711 Unilateral primary osteoarthritis, right knee: Secondary | ICD-10-CM | POA: Diagnosis not present

## 2020-09-03 DIAGNOSIS — M25461 Effusion, right knee: Secondary | ICD-10-CM | POA: Diagnosis not present

## 2020-09-07 DIAGNOSIS — Z0000 Encounter for general adult medical examination without abnormal findings: Secondary | ICD-10-CM | POA: Diagnosis not present

## 2020-09-07 DIAGNOSIS — M25552 Pain in left hip: Secondary | ICD-10-CM | POA: Diagnosis not present

## 2020-09-07 DIAGNOSIS — M25562 Pain in left knee: Secondary | ICD-10-CM | POA: Diagnosis not present

## 2020-09-07 DIAGNOSIS — M25561 Pain in right knee: Secondary | ICD-10-CM | POA: Diagnosis not present

## 2020-09-07 DIAGNOSIS — M25551 Pain in right hip: Secondary | ICD-10-CM | POA: Diagnosis not present

## 2020-09-07 DIAGNOSIS — Z13220 Encounter for screening for lipoid disorders: Secondary | ICD-10-CM | POA: Diagnosis not present

## 2020-09-08 DIAGNOSIS — M170 Bilateral primary osteoarthritis of knee: Secondary | ICD-10-CM | POA: Diagnosis not present

## 2020-09-12 DIAGNOSIS — Z03818 Encounter for observation for suspected exposure to other biological agents ruled out: Secondary | ICD-10-CM | POA: Diagnosis not present

## 2020-09-14 DIAGNOSIS — Z03818 Encounter for observation for suspected exposure to other biological agents ruled out: Secondary | ICD-10-CM | POA: Diagnosis not present

## 2020-09-23 DIAGNOSIS — Z20822 Contact with and (suspected) exposure to covid-19: Secondary | ICD-10-CM | POA: Diagnosis not present

## 2020-09-23 DIAGNOSIS — M170 Bilateral primary osteoarthritis of knee: Secondary | ICD-10-CM | POA: Diagnosis not present

## 2020-09-24 DIAGNOSIS — Z23 Encounter for immunization: Secondary | ICD-10-CM | POA: Diagnosis not present

## 2020-09-24 DIAGNOSIS — H6121 Impacted cerumen, right ear: Secondary | ICD-10-CM | POA: Diagnosis not present

## 2020-09-24 DIAGNOSIS — E785 Hyperlipidemia, unspecified: Secondary | ICD-10-CM | POA: Diagnosis not present

## 2020-09-24 DIAGNOSIS — Z1211 Encounter for screening for malignant neoplasm of colon: Secondary | ICD-10-CM | POA: Diagnosis not present

## 2020-09-24 DIAGNOSIS — D229 Melanocytic nevi, unspecified: Secondary | ICD-10-CM | POA: Diagnosis not present

## 2020-09-24 DIAGNOSIS — Z118 Encounter for screening for other infectious and parasitic diseases: Secondary | ICD-10-CM | POA: Diagnosis not present

## 2020-09-24 DIAGNOSIS — Z01419 Encounter for gynecological examination (general) (routine) without abnormal findings: Secondary | ICD-10-CM | POA: Diagnosis not present

## 2020-09-24 DIAGNOSIS — Z0000 Encounter for general adult medical examination without abnormal findings: Secondary | ICD-10-CM | POA: Diagnosis not present

## 2020-09-28 DIAGNOSIS — M25552 Pain in left hip: Secondary | ICD-10-CM | POA: Diagnosis not present

## 2020-09-28 DIAGNOSIS — M25561 Pain in right knee: Secondary | ICD-10-CM | POA: Diagnosis not present

## 2020-09-28 DIAGNOSIS — M25562 Pain in left knee: Secondary | ICD-10-CM | POA: Diagnosis not present

## 2020-09-28 DIAGNOSIS — M25551 Pain in right hip: Secondary | ICD-10-CM | POA: Diagnosis not present

## 2020-09-30 DIAGNOSIS — M1711 Unilateral primary osteoarthritis, right knee: Secondary | ICD-10-CM | POA: Diagnosis not present

## 2020-10-07 DIAGNOSIS — R5383 Other fatigue: Secondary | ICD-10-CM | POA: Diagnosis not present

## 2020-10-07 DIAGNOSIS — G4700 Insomnia, unspecified: Secondary | ICD-10-CM | POA: Diagnosis not present

## 2020-10-07 DIAGNOSIS — N951 Menopausal and female climacteric states: Secondary | ICD-10-CM | POA: Diagnosis not present

## 2020-10-07 DIAGNOSIS — G43909 Migraine, unspecified, not intractable, without status migrainosus: Secondary | ICD-10-CM | POA: Diagnosis not present

## 2020-10-12 DIAGNOSIS — M25552 Pain in left hip: Secondary | ICD-10-CM | POA: Diagnosis not present

## 2020-10-12 DIAGNOSIS — M25551 Pain in right hip: Secondary | ICD-10-CM | POA: Diagnosis not present

## 2020-10-12 DIAGNOSIS — M25561 Pain in right knee: Secondary | ICD-10-CM | POA: Diagnosis not present

## 2020-10-12 DIAGNOSIS — M25562 Pain in left knee: Secondary | ICD-10-CM | POA: Diagnosis not present

## 2020-10-19 DIAGNOSIS — Z03818 Encounter for observation for suspected exposure to other biological agents ruled out: Secondary | ICD-10-CM | POA: Diagnosis not present

## 2020-11-02 ENCOUNTER — Ambulatory Visit (AMBULATORY_SURGERY_CENTER): Payer: BC Managed Care – PPO

## 2020-11-02 ENCOUNTER — Other Ambulatory Visit: Payer: Self-pay

## 2020-11-02 VITALS — Ht 65.5 in | Wt 150.0 lb

## 2020-11-02 DIAGNOSIS — Z1211 Encounter for screening for malignant neoplasm of colon: Secondary | ICD-10-CM

## 2020-11-02 NOTE — Progress Notes (Signed)
Pre visit completed via phone call;  Patient verified name, DOB, and address; No egg or soy allergy known to patient  No issues with past sedation with any surgeries or procedures No intubation problems in the past  No FH of Malignant Hyperthermia No diet pills per patient No home 02 use per patient  No blood thinners per patient  Pt denies issues with constipation  No A fib or A flutter  COVID 19 guidelines implemented in PV today with Pt and RN  Pt is fully vaccinated for Covid x 2 + booster= Pt denies loose or missing teeth; Patient denies dentures, partials, or bonded teeth; Patient reports capped teeth and dental implants in process; Coupon given to pt in PV today , Code to Pharmacy and  NO PA's for preps discussed with pt in PV today  Discussed with pt there will be an out-of-pocket cost for prep and that varies from $0 to 70 dollars  Due to the COVID-19 pandemic we are asking patients to follow certain guidelines.  Pt aware of COVID protocols and [facility] guidelines

## 2020-11-18 DIAGNOSIS — R519 Headache, unspecified: Secondary | ICD-10-CM | POA: Diagnosis not present

## 2020-11-18 DIAGNOSIS — G4700 Insomnia, unspecified: Secondary | ICD-10-CM | POA: Diagnosis not present

## 2020-11-18 DIAGNOSIS — N951 Menopausal and female climacteric states: Secondary | ICD-10-CM | POA: Diagnosis not present

## 2020-11-29 DIAGNOSIS — Z03818 Encounter for observation for suspected exposure to other biological agents ruled out: Secondary | ICD-10-CM | POA: Diagnosis not present

## 2020-12-03 ENCOUNTER — Encounter: Payer: BC Managed Care – PPO | Admitting: Gastroenterology

## 2020-12-17 ENCOUNTER — Encounter: Payer: Self-pay | Admitting: Gastroenterology

## 2020-12-17 DIAGNOSIS — E782 Mixed hyperlipidemia: Secondary | ICD-10-CM | POA: Diagnosis not present

## 2020-12-21 ENCOUNTER — Ambulatory Visit (AMBULATORY_SURGERY_CENTER): Payer: BC Managed Care – PPO | Admitting: Gastroenterology

## 2020-12-21 ENCOUNTER — Other Ambulatory Visit: Payer: Self-pay

## 2020-12-21 ENCOUNTER — Encounter: Payer: Self-pay | Admitting: Gastroenterology

## 2020-12-21 VITALS — BP 126/87 | HR 48 | Temp 97.3°F | Resp 15 | Ht 65.0 in | Wt 150.0 lb

## 2020-12-21 DIAGNOSIS — D122 Benign neoplasm of ascending colon: Secondary | ICD-10-CM

## 2020-12-21 DIAGNOSIS — K635 Polyp of colon: Secondary | ICD-10-CM

## 2020-12-21 DIAGNOSIS — Z1211 Encounter for screening for malignant neoplasm of colon: Secondary | ICD-10-CM | POA: Diagnosis not present

## 2020-12-21 MED ORDER — SODIUM CHLORIDE 0.9 % IV SOLN: 500.0000 mL | Freq: Once | INTRAVENOUS | Status: DC

## 2020-12-21 NOTE — Progress Notes (Signed)
Called to room to assist during endoscopic procedure.  Patient ID and intended procedure confirmed with present staff. Received instructions for my participation in the procedure from the performing physician.  

## 2020-12-21 NOTE — Progress Notes (Signed)
Vitals by SM. Pt's states no medical or surgical changes since previsit or office visit.

## 2020-12-21 NOTE — Op Note (Signed)
[facility] Patient Name: Mariah Irwin Procedure Date: 12/21/2020 3:56 PM MRN: [id number] Endoscopist: Thornton Park MD, MD Age: 50 Referring MD:  Date of Birth: 07-26-1971 Gender: Female Account #: [id number] Procedure:                Colonoscopy Indications:              Screening for colorectal malignant neoplasm, This                            is the patient's first colonoscopy                           No known family history of colon cancer or polyps Medicines:                Monitored Anesthesia Care Procedure:                Pre-Anesthesia Assessment:                           - Prior to the procedure, a History and Physical                            was performed, and patient medications and                            allergies were reviewed. The patient's tolerance of                            previous anesthesia was also reviewed. The risks                            and benefits of the procedure and the sedation                            options and risks were discussed with the patient.                            All questions were answered, and informed consent                            was obtained. Prior Anticoagulants: The patient has                            taken no previous anticoagulant or antiplatelet                            agents. ASA Grade Assessment: II - A patient with                            mild systemic disease. After reviewing the risks                            and benefits, the patient was deemed in  satisfactory condition to undergo the procedure.                           After obtaining informed consent, the colonoscope                            was passed under direct vision. Throughout the                            procedure, the patient's blood pressure, pulse, and                            oxygen saturations were monitored continuously. The                            Olympus PCF-H190DL  (#[id number]) Colonoscope was                            introduced through the anus and advanced to the 3                            cm into the ileum. The colonoscopy was performed                            without difficulty. The patient tolerated the                            procedure well. The quality of the bowel                            preparation was good. The terminal ileum, ileocecal                            valve, appendiceal orifice, and rectum were                            photographed. Scope In: 3:59:30 PM Scope Out: 4:13:50 PM Scope Withdrawal Time: 0 hours 11 minutes 48 seconds  Total Procedure Duration: 0 hours 14 minutes 20 seconds  Findings:                 The perianal and digital rectal examinations were                            normal.                           Multiple small and large-mouthed diverticula were                            found in the entire colon.                           A less than 1 mm possible polyp was found in the  ascending colon. The polyp was sessile. The polyp                            was removed with a cold biopsy forceps. Resection                            and retrieval were complete. Estimated blood loss                            was minimal.                           The exam was otherwise without abnormality on                            direct and retroflexion views. Complications:            No immediate complications. Estimated blood loss:                            Minimal. Estimated Blood Loss:     Estimated blood loss was minimal. Impression:               - Diverticulosis in the entire examined colon.                           - One less than 1 mm polyp in the ascending colon,                            removed with a cold biopsy forceps. Resected and                            retrieved.                           - The examination was otherwise normal on direct                             and retroflexion views. Recommendation:           - Patient has a contact number available for                            emergencies. The signs and symptoms of potential                            delayed complications were discussed with the                            patient. Return to normal activities tomorrow.                            Written discharge instructions were provided to the                            patient.                           -  Resume previous diet.                           - Continue present medications.                           - Await pathology results.                           - Repeat colonoscopy date to be determined after                            pending pathology results are reviewed for                            surveillance.                           - Follow a high fiber diet. Drink at least 64                            ounces of water daily. Add a daily stool bulking                            agent such as psyllium (an exampled would be                            Metamucil).                           - Emerging evidence supports eating a diet of                            fruits, vegetables, grains, calcium, and yogurt                            while reducing red meat and alcohol may reduce the                            risk of colon cancer.                           - Thank you for allowing me to be involved in your                            colon cancer prevention. Thornton Park MD, MD 12/21/2020 4:21:01 PM This report has been signed electronically.

## 2020-12-21 NOTE — Progress Notes (Signed)
Report given to PACU, vss 

## 2020-12-21 NOTE — Patient Instructions (Signed)
Handouts given for polyps, diverticulosis and High Fiber Diet.  Try taking a fiber supplement like Metamucil and drink at least 64 oz of water per day.  YOU HAD AN ENDOSCOPIC PROCEDURE TODAY AT [facility]:   Refer to the procedure report that was given to you for any specific questions about what was found during the examination.  If the procedure report does not answer your questions, please call your gastroenterologist to clarify.  If you requested that your care partner not be given the details of your procedure findings, then the procedure report has been included in a sealed envelope for you to review at your convenience later.  YOU SHOULD EXPECT: Some feelings of bloating in the abdomen. Passage of more gas than usual.  Walking can help get rid of the air that was put into your GI tract during the procedure and reduce the bloating. If you had a lower endoscopy (such as a colonoscopy or flexible sigmoidoscopy) you may notice spotting of blood in your stool or on the toilet paper. If you underwent a bowel prep for your procedure, you may not have a normal bowel movement for a few days.  Please Note:  You might notice some irritation and congestion in your nose or some drainage.  This is from the oxygen used during your procedure.  There is no need for concern and it should clear up in a day or so.  SYMPTOMS TO REPORT IMMEDIATELY:   Following lower endoscopy (colonoscopy or flexible sigmoidoscopy):  Excessive amounts of blood in the stool  Significant tenderness or worsening of abdominal pains  Swelling of the abdomen that is new, acute  Fever of 100F or higher  For urgent or emergent issues, a gastroenterologist can be reached at any hour by calling [phone]. Do not use MyChart messaging for urgent concerns.    DIET:  We do recommend a small meal at first, but then you may proceed to your regular diet.  Drink plenty of fluids but you should avoid alcoholic  beverages for 24 hours.  ACTIVITY:  You should plan to take it easy for the rest of today and you should NOT DRIVE or use heavy machinery until tomorrow (because of the sedation medicines used during the test).    FOLLOW UP: Our staff will call the number listed on your records 48-72 hours following your procedure to check on you and address any questions or concerns that you may have regarding the information given to you following your procedure. If we do not reach you, we will leave a message.  We will attempt to reach you two times.  During this call, we will ask if you have developed any symptoms of COVID 19. If you develop any symptoms (ie: fever, flu-like symptoms, shortness of breath, cough etc.) before then, please call [phone].  If you test positive for Covid 19 in the 2 weeks post procedure, please call and report this information to [place].    If any biopsies were taken you will be contacted by phone or by letter within the next 1-3 weeks.  Please call us at [phone] if you have not heard about the biopsies in 3 weeks.    SIGNATURES/CONFIDENTIALITY: You and/or your care partner have signed paperwork which will be entered into your electronic medical record.  These signatures attest to the fact that that the information above on your After Visit Summary has been reviewed and is understood.  Full responsibility of the confidentiality of  this discharge information lies with you and/or your care-partner.

## 2020-12-23 ENCOUNTER — Telehealth: Payer: Self-pay

## 2020-12-23 DIAGNOSIS — M170 Bilateral primary osteoarthritis of knee: Secondary | ICD-10-CM | POA: Diagnosis not present

## 2020-12-23 DIAGNOSIS — E782 Mixed hyperlipidemia: Secondary | ICD-10-CM | POA: Diagnosis not present

## 2020-12-23 NOTE — Telephone Encounter (Signed)
  Follow up Call-  Call back number 12/21/2020  Post procedure Call Back phone  # [phone]  Permission to leave phone message Yes  Some recent data might be hidden     Patient questions:  Do you have a fever, pain , or abdominal swelling? No. Pain Score  0 *  Have you tolerated food without any problems? Yes.    Have you been able to return to your normal activities? Yes.    Do you have any questions about your discharge instructions: Diet   No. Medications  No. Follow up visit  No.  Do you have questions or concerns about your Care? No.  Actions: * If pain score is 4 or above: No action needed, pain <4.   1. Have you developed a fever since your procedure? No   2.   Have you had an respiratory symptoms (SOB or cough) since your procedure? no  3.   Have you tested positive for COVID 19 since your procedure? No   4.   Have you had any family members/close contacts diagnosed with the COVID 19 since your procedure?  No    If yes to any of these questions please route to Joylene John, RN and Joella Prince, RN

## 2020-12-30 ENCOUNTER — Encounter: Payer: Self-pay | Admitting: Gastroenterology

## 2020-12-30 DIAGNOSIS — M170 Bilateral primary osteoarthritis of knee: Secondary | ICD-10-CM | POA: Diagnosis not present

## 2021-02-01 DIAGNOSIS — L309 Dermatitis, unspecified: Secondary | ICD-10-CM | POA: Diagnosis not present

## 2021-02-28 DIAGNOSIS — L089 Local infection of the skin and subcutaneous tissue, unspecified: Secondary | ICD-10-CM | POA: Diagnosis not present

## 2021-02-28 DIAGNOSIS — Z20822 Contact with and (suspected) exposure to covid-19: Secondary | ICD-10-CM | POA: Diagnosis not present

## 2021-02-28 DIAGNOSIS — R21 Rash and other nonspecific skin eruption: Secondary | ICD-10-CM | POA: Diagnosis not present

## 2021-02-28 DIAGNOSIS — L309 Dermatitis, unspecified: Secondary | ICD-10-CM | POA: Diagnosis not present

## 2021-05-03 DIAGNOSIS — M170 Bilateral primary osteoarthritis of knee: Secondary | ICD-10-CM | POA: Diagnosis not present

## 2021-05-10 DIAGNOSIS — M170 Bilateral primary osteoarthritis of knee: Secondary | ICD-10-CM | POA: Diagnosis not present

## 2021-05-10 DIAGNOSIS — Z23 Encounter for immunization: Secondary | ICD-10-CM | POA: Diagnosis not present

## 2021-05-17 DIAGNOSIS — M170 Bilateral primary osteoarthritis of knee: Secondary | ICD-10-CM | POA: Diagnosis not present

## 2021-05-26 DIAGNOSIS — R519 Headache, unspecified: Secondary | ICD-10-CM | POA: Diagnosis not present

## 2021-05-26 DIAGNOSIS — F411 Generalized anxiety disorder: Secondary | ICD-10-CM | POA: Diagnosis not present

## 2021-05-26 DIAGNOSIS — F331 Major depressive disorder, recurrent, moderate: Secondary | ICD-10-CM | POA: Diagnosis not present

## 2021-05-26 DIAGNOSIS — I10 Essential (primary) hypertension: Secondary | ICD-10-CM | POA: Diagnosis not present

## 2021-07-12 DIAGNOSIS — M25531 Pain in right wrist: Secondary | ICD-10-CM | POA: Diagnosis not present

## 2021-09-23 DIAGNOSIS — Z0000 Encounter for general adult medical examination without abnormal findings: Secondary | ICD-10-CM | POA: Diagnosis not present

## 2021-09-26 DIAGNOSIS — G2581 Restless legs syndrome: Secondary | ICD-10-CM | POA: Diagnosis not present

## 2021-09-26 DIAGNOSIS — N951 Menopausal and female climacteric states: Secondary | ICD-10-CM | POA: Diagnosis not present

## 2021-09-26 DIAGNOSIS — G4700 Insomnia, unspecified: Secondary | ICD-10-CM | POA: Diagnosis not present

## 2021-09-27 DIAGNOSIS — H6123 Impacted cerumen, bilateral: Secondary | ICD-10-CM | POA: Diagnosis not present

## 2021-09-27 DIAGNOSIS — N760 Acute vaginitis: Secondary | ICD-10-CM | POA: Diagnosis not present

## 2021-09-27 DIAGNOSIS — Z0000 Encounter for general adult medical examination without abnormal findings: Secondary | ICD-10-CM | POA: Diagnosis not present

## 2021-09-27 DIAGNOSIS — H6122 Impacted cerumen, left ear: Secondary | ICD-10-CM | POA: Diagnosis not present

## 2021-09-27 DIAGNOSIS — N390 Urinary tract infection, site not specified: Secondary | ICD-10-CM | POA: Diagnosis not present

## 2021-09-27 DIAGNOSIS — H6121 Impacted cerumen, right ear: Secondary | ICD-10-CM | POA: Diagnosis not present

## 2021-09-27 DIAGNOSIS — Z23 Encounter for immunization: Secondary | ICD-10-CM | POA: Diagnosis not present

## 2021-09-27 DIAGNOSIS — N3000 Acute cystitis without hematuria: Secondary | ICD-10-CM | POA: Diagnosis not present

## 2021-10-10 DIAGNOSIS — F331 Major depressive disorder, recurrent, moderate: Secondary | ICD-10-CM | POA: Diagnosis not present

## 2021-10-10 DIAGNOSIS — G2581 Restless legs syndrome: Secondary | ICD-10-CM | POA: Diagnosis not present

## 2021-10-10 DIAGNOSIS — F411 Generalized anxiety disorder: Secondary | ICD-10-CM | POA: Diagnosis not present

## 2021-10-10 DIAGNOSIS — G4700 Insomnia, unspecified: Secondary | ICD-10-CM | POA: Diagnosis not present

## 2021-11-22 DIAGNOSIS — M542 Cervicalgia: Secondary | ICD-10-CM | POA: Diagnosis not present

## 2021-11-22 DIAGNOSIS — M7650 Patellar tendinitis, unspecified knee: Secondary | ICD-10-CM | POA: Diagnosis not present

## 2021-11-22 DIAGNOSIS — M170 Bilateral primary osteoarthritis of knee: Secondary | ICD-10-CM | POA: Diagnosis not present

## 2021-12-12 ENCOUNTER — Other Ambulatory Visit: Payer: Self-pay | Admitting: Family Medicine

## 2021-12-12 DIAGNOSIS — Z1231 Encounter for screening mammogram for malignant neoplasm of breast: Secondary | ICD-10-CM

## 2021-12-13 DIAGNOSIS — N952 Postmenopausal atrophic vaginitis: Secondary | ICD-10-CM | POA: Diagnosis not present

## 2021-12-13 DIAGNOSIS — G4700 Insomnia, unspecified: Secondary | ICD-10-CM | POA: Diagnosis not present

## 2021-12-13 DIAGNOSIS — G2581 Restless legs syndrome: Secondary | ICD-10-CM | POA: Diagnosis not present

## 2021-12-13 DIAGNOSIS — F411 Generalized anxiety disorder: Secondary | ICD-10-CM | POA: Diagnosis not present

## 2021-12-15 DIAGNOSIS — M170 Bilateral primary osteoarthritis of knee: Secondary | ICD-10-CM | POA: Diagnosis not present

## 2021-12-19 ENCOUNTER — Ambulatory Visit
Admission: RE | Admit: 2021-12-19 | Discharge: 2021-12-19 | Disposition: A | Discharge status: Home / Self Care | Payer: BC Managed Care – PPO | Source: Ambulatory Visit | Attending: Family Medicine | Admitting: Family Medicine

## 2021-12-19 DIAGNOSIS — Z1231 Encounter for screening mammogram for malignant neoplasm of breast: Secondary | ICD-10-CM

## 2021-12-22 DIAGNOSIS — M170 Bilateral primary osteoarthritis of knee: Secondary | ICD-10-CM | POA: Diagnosis not present

## 2022-01-05 DIAGNOSIS — M170 Bilateral primary osteoarthritis of knee: Secondary | ICD-10-CM | POA: Diagnosis not present

## 2022-01-05 DIAGNOSIS — M7740 Metatarsalgia, unspecified foot: Secondary | ICD-10-CM | POA: Diagnosis not present

## 2022-01-18 DIAGNOSIS — S99929A Unspecified injury of unspecified foot, initial encounter: Secondary | ICD-10-CM | POA: Diagnosis not present

## 2022-01-18 DIAGNOSIS — S93409A Sprain of unspecified ligament of unspecified ankle, initial encounter: Secondary | ICD-10-CM | POA: Diagnosis not present

## 2022-01-19 ENCOUNTER — Ambulatory Visit
Admission: RE | Admit: 2022-01-19 | Discharge: 2022-01-19 | Disposition: A | Discharge status: Home / Self Care | Payer: BC Managed Care – PPO | Source: Ambulatory Visit | Attending: Family Medicine | Admitting: Family Medicine

## 2022-01-19 ENCOUNTER — Other Ambulatory Visit: Payer: Self-pay | Admitting: Family Medicine

## 2022-01-19 DIAGNOSIS — M2012 Hallux valgus (acquired), left foot: Secondary | ICD-10-CM | POA: Diagnosis not present

## 2022-01-19 DIAGNOSIS — S99912A Unspecified injury of left ankle, initial encounter: Secondary | ICD-10-CM | POA: Diagnosis not present

## 2022-01-19 DIAGNOSIS — M25572 Pain in left ankle and joints of left foot: Secondary | ICD-10-CM

## 2022-01-19 DIAGNOSIS — S92352A Displaced fracture of fifth metatarsal bone, left foot, initial encounter for closed fracture: Secondary | ICD-10-CM | POA: Diagnosis not present

## 2022-01-19 DIAGNOSIS — M7989 Other specified soft tissue disorders: Secondary | ICD-10-CM | POA: Diagnosis not present

## 2022-01-20 DIAGNOSIS — M79672 Pain in left foot: Secondary | ICD-10-CM | POA: Diagnosis not present

## 2022-01-20 DIAGNOSIS — S92355A Nondisplaced fracture of fifth metatarsal bone, left foot, initial encounter for closed fracture: Secondary | ICD-10-CM | POA: Diagnosis not present

## 2022-02-17 DIAGNOSIS — S92355A Nondisplaced fracture of fifth metatarsal bone, left foot, initial encounter for closed fracture: Secondary | ICD-10-CM | POA: Diagnosis not present

## 2022-02-17 DIAGNOSIS — S93492A Sprain of other ligament of left ankle, initial encounter: Secondary | ICD-10-CM | POA: Diagnosis not present

## 2022-03-30 DIAGNOSIS — G2581 Restless legs syndrome: Secondary | ICD-10-CM | POA: Diagnosis not present

## 2022-03-30 DIAGNOSIS — E876 Hypokalemia: Secondary | ICD-10-CM | POA: Diagnosis not present

## 2022-04-04 DIAGNOSIS — M170 Bilateral primary osteoarthritis of knee: Secondary | ICD-10-CM | POA: Diagnosis not present

## 2022-04-04 DIAGNOSIS — I10 Essential (primary) hypertension: Secondary | ICD-10-CM | POA: Diagnosis not present

## 2022-04-04 DIAGNOSIS — E876 Hypokalemia: Secondary | ICD-10-CM | POA: Diagnosis not present

## 2022-04-04 DIAGNOSIS — G2581 Restless legs syndrome: Secondary | ICD-10-CM | POA: Diagnosis not present

## 2022-06-21 DIAGNOSIS — E782 Mixed hyperlipidemia: Secondary | ICD-10-CM | POA: Diagnosis not present

## 2022-06-28 DIAGNOSIS — E782 Mixed hyperlipidemia: Secondary | ICD-10-CM | POA: Diagnosis not present

## 2022-06-28 DIAGNOSIS — I10 Essential (primary) hypertension: Secondary | ICD-10-CM | POA: Diagnosis not present

## 2022-07-13 DIAGNOSIS — M170 Bilateral primary osteoarthritis of knee: Secondary | ICD-10-CM | POA: Diagnosis not present

## 2022-07-13 DIAGNOSIS — S0991XA Unspecified injury of ear, initial encounter: Secondary | ICD-10-CM | POA: Diagnosis not present

## 2022-07-20 DIAGNOSIS — M170 Bilateral primary osteoarthritis of knee: Secondary | ICD-10-CM | POA: Diagnosis not present

## 2022-07-27 DIAGNOSIS — M170 Bilateral primary osteoarthritis of knee: Secondary | ICD-10-CM | POA: Diagnosis not present

## 2022-09-25 DIAGNOSIS — Z0000 Encounter for general adult medical examination without abnormal findings: Secondary | ICD-10-CM | POA: Diagnosis not present

## 2022-09-25 DIAGNOSIS — Z114 Encounter for screening for human immunodeficiency virus [HIV]: Secondary | ICD-10-CM | POA: Diagnosis not present

## 2022-09-25 DIAGNOSIS — Z13220 Encounter for screening for lipoid disorders: Secondary | ICD-10-CM | POA: Diagnosis not present

## 2022-09-27 DIAGNOSIS — E782 Mixed hyperlipidemia: Secondary | ICD-10-CM | POA: Diagnosis not present

## 2022-09-27 DIAGNOSIS — M1990 Unspecified osteoarthritis, unspecified site: Secondary | ICD-10-CM | POA: Diagnosis not present

## 2022-09-27 DIAGNOSIS — R7303 Prediabetes: Secondary | ICD-10-CM | POA: Diagnosis not present

## 2022-09-29 DIAGNOSIS — Z0000 Encounter for general adult medical examination without abnormal findings: Secondary | ICD-10-CM | POA: Diagnosis not present

## 2022-09-29 DIAGNOSIS — N6320 Unspecified lump in the left breast, unspecified quadrant: Secondary | ICD-10-CM | POA: Diagnosis not present

## 2022-09-29 DIAGNOSIS — M1990 Unspecified osteoarthritis, unspecified site: Secondary | ICD-10-CM | POA: Diagnosis not present

## 2022-09-29 DIAGNOSIS — Z1211 Encounter for screening for malignant neoplasm of colon: Secondary | ICD-10-CM | POA: Diagnosis not present

## 2022-09-29 DIAGNOSIS — G2581 Restless legs syndrome: Secondary | ICD-10-CM | POA: Diagnosis not present

## 2022-10-05 ENCOUNTER — Other Ambulatory Visit: Payer: Self-pay | Admitting: Physician Assistant

## 2022-10-05 DIAGNOSIS — R928 Other abnormal and inconclusive findings on diagnostic imaging of breast: Secondary | ICD-10-CM

## 2022-10-06 ENCOUNTER — Other Ambulatory Visit: Payer: Self-pay | Admitting: Physician Assistant

## 2022-10-12 ENCOUNTER — Ambulatory Visit
Admission: RE | Admit: 2022-10-12 | Discharge: 2022-10-12 | Disposition: A | Discharge status: Home / Self Care | Payer: Self-pay | Source: Ambulatory Visit | Attending: Physician Assistant | Admitting: Physician Assistant

## 2022-10-12 ENCOUNTER — Ambulatory Visit
Admission: RE | Admit: 2022-10-12 | Discharge: 2022-10-12 | Disposition: A | Discharge status: Home / Self Care | Payer: BC Managed Care – PPO | Source: Ambulatory Visit | Attending: Physician Assistant | Admitting: Physician Assistant

## 2022-10-12 DIAGNOSIS — R928 Other abnormal and inconclusive findings on diagnostic imaging of breast: Secondary | ICD-10-CM

## 2022-10-12 DIAGNOSIS — N6489 Other specified disorders of breast: Secondary | ICD-10-CM | POA: Diagnosis not present

## 2022-11-02 DIAGNOSIS — M170 Bilateral primary osteoarthritis of knee: Secondary | ICD-10-CM | POA: Diagnosis not present

## 2022-11-02 DIAGNOSIS — E876 Hypokalemia: Secondary | ICD-10-CM | POA: Diagnosis not present

## 2022-11-02 DIAGNOSIS — I10 Essential (primary) hypertension: Secondary | ICD-10-CM | POA: Diagnosis not present

## 2022-11-02 DIAGNOSIS — E782 Mixed hyperlipidemia: Secondary | ICD-10-CM | POA: Diagnosis not present

## 2022-12-11 DIAGNOSIS — M2242 Chondromalacia patellae, left knee: Secondary | ICD-10-CM | POA: Diagnosis not present

## 2022-12-11 DIAGNOSIS — M25562 Pain in left knee: Secondary | ICD-10-CM | POA: Diagnosis not present

## 2022-12-11 DIAGNOSIS — M23200 Derangement of unspecified lateral meniscus due to old tear or injury, right knee: Secondary | ICD-10-CM | POA: Diagnosis not present

## 2022-12-11 DIAGNOSIS — M25561 Pain in right knee: Secondary | ICD-10-CM | POA: Diagnosis not present

## 2022-12-25 DIAGNOSIS — M25561 Pain in right knee: Secondary | ICD-10-CM | POA: Diagnosis not present

## 2022-12-25 DIAGNOSIS — M25562 Pain in left knee: Secondary | ICD-10-CM | POA: Diagnosis not present

## 2023-01-01 DIAGNOSIS — M25521 Pain in right elbow: Secondary | ICD-10-CM | POA: Diagnosis not present

## 2023-01-01 DIAGNOSIS — M25561 Pain in right knee: Secondary | ICD-10-CM | POA: Diagnosis not present

## 2023-01-01 DIAGNOSIS — M25562 Pain in left knee: Secondary | ICD-10-CM | POA: Diagnosis not present

## 2023-01-08 DIAGNOSIS — I10 Essential (primary) hypertension: Secondary | ICD-10-CM | POA: Diagnosis not present

## 2023-01-08 DIAGNOSIS — Z01818 Encounter for other preprocedural examination: Secondary | ICD-10-CM | POA: Diagnosis not present

## 2023-01-18 DIAGNOSIS — M1711 Unilateral primary osteoarthritis, right knee: Secondary | ICD-10-CM | POA: Diagnosis not present

## 2023-01-22 DIAGNOSIS — M93929 Osteochondropathy, unspecified, unspecified upper arm: Secondary | ICD-10-CM | POA: Diagnosis not present

## 2023-01-24 DIAGNOSIS — E782 Mixed hyperlipidemia: Secondary | ICD-10-CM | POA: Diagnosis not present

## 2023-01-24 DIAGNOSIS — E1165 Type 2 diabetes mellitus with hyperglycemia: Secondary | ICD-10-CM | POA: Diagnosis not present

## 2023-01-26 DIAGNOSIS — R7303 Prediabetes: Secondary | ICD-10-CM | POA: Diagnosis not present

## 2023-01-26 DIAGNOSIS — E782 Mixed hyperlipidemia: Secondary | ICD-10-CM | POA: Diagnosis not present

## 2023-01-26 DIAGNOSIS — Z8742 Personal history of other diseases of the female genital tract: Secondary | ICD-10-CM | POA: Diagnosis not present

## 2023-01-26 DIAGNOSIS — M1711 Unilateral primary osteoarthritis, right knee: Secondary | ICD-10-CM | POA: Diagnosis not present

## 2023-01-30 ENCOUNTER — Other Ambulatory Visit: Payer: Self-pay | Admitting: Nurse Practitioner

## 2023-01-30 DIAGNOSIS — Z8742 Personal history of other diseases of the female genital tract: Secondary | ICD-10-CM

## 2023-01-30 DIAGNOSIS — R140 Abdominal distension (gaseous): Secondary | ICD-10-CM

## 2023-02-06 DIAGNOSIS — N3000 Acute cystitis without hematuria: Secondary | ICD-10-CM | POA: Diagnosis not present

## 2023-02-06 DIAGNOSIS — N390 Urinary tract infection, site not specified: Secondary | ICD-10-CM | POA: Diagnosis not present

## 2023-02-19 DIAGNOSIS — M1711 Unilateral primary osteoarthritis, right knee: Secondary | ICD-10-CM | POA: Diagnosis not present

## 2023-02-19 DIAGNOSIS — M7701 Medial epicondylitis, right elbow: Secondary | ICD-10-CM | POA: Diagnosis not present

## 2023-03-07 ENCOUNTER — Ambulatory Visit
Admission: RE | Admit: 2023-03-07 | Discharge: 2023-03-07 | Disposition: A | Discharge status: Home / Self Care | Payer: BC Managed Care – PPO | Source: Ambulatory Visit | Attending: Nurse Practitioner | Admitting: Nurse Practitioner

## 2023-03-07 DIAGNOSIS — R140 Abdominal distension (gaseous): Secondary | ICD-10-CM | POA: Diagnosis not present

## 2023-03-07 DIAGNOSIS — Z8742 Personal history of other diseases of the female genital tract: Secondary | ICD-10-CM

## 2023-03-07 DIAGNOSIS — N809 Endometriosis, unspecified: Secondary | ICD-10-CM | POA: Diagnosis not present

## 2023-05-15 DIAGNOSIS — L680 Hirsutism: Secondary | ICD-10-CM | POA: Diagnosis not present

## 2023-05-15 DIAGNOSIS — L821 Other seborrheic keratosis: Secondary | ICD-10-CM | POA: Diagnosis not present

## 2023-05-15 DIAGNOSIS — L738 Other specified follicular disorders: Secondary | ICD-10-CM | POA: Diagnosis not present

## 2023-05-24 DIAGNOSIS — E876 Hypokalemia: Secondary | ICD-10-CM | POA: Diagnosis not present

## 2023-05-24 DIAGNOSIS — L678 Other hair color and hair shaft abnormalities: Secondary | ICD-10-CM | POA: Diagnosis not present

## 2023-05-24 DIAGNOSIS — I10 Essential (primary) hypertension: Secondary | ICD-10-CM | POA: Diagnosis not present

## 2023-05-24 DIAGNOSIS — M25569 Pain in unspecified knee: Secondary | ICD-10-CM | POA: Diagnosis not present

## 2023-05-24 DIAGNOSIS — Z23 Encounter for immunization: Secondary | ICD-10-CM | POA: Diagnosis not present

## 2023-06-07 DIAGNOSIS — I10 Essential (primary) hypertension: Secondary | ICD-10-CM | POA: Diagnosis not present

## 2023-06-07 DIAGNOSIS — E876 Hypokalemia: Secondary | ICD-10-CM | POA: Diagnosis not present

## 2023-06-11 DIAGNOSIS — F411 Generalized anxiety disorder: Secondary | ICD-10-CM | POA: Diagnosis not present

## 2023-06-11 DIAGNOSIS — G4700 Insomnia, unspecified: Secondary | ICD-10-CM | POA: Diagnosis not present

## 2023-06-11 DIAGNOSIS — I10 Essential (primary) hypertension: Secondary | ICD-10-CM | POA: Diagnosis not present

## 2023-06-11 DIAGNOSIS — N951 Menopausal and female climacteric states: Secondary | ICD-10-CM | POA: Diagnosis not present

## 2023-07-26 DIAGNOSIS — E785 Hyperlipidemia, unspecified: Secondary | ICD-10-CM | POA: Diagnosis not present

## 2023-07-26 DIAGNOSIS — R5383 Other fatigue: Secondary | ICD-10-CM | POA: Diagnosis not present

## 2023-07-26 DIAGNOSIS — R739 Hyperglycemia, unspecified: Secondary | ICD-10-CM | POA: Diagnosis not present

## 2023-08-02 DIAGNOSIS — G4700 Insomnia, unspecified: Secondary | ICD-10-CM | POA: Diagnosis not present

## 2023-08-02 DIAGNOSIS — E782 Mixed hyperlipidemia: Secondary | ICD-10-CM | POA: Diagnosis not present

## 2023-08-02 DIAGNOSIS — Z789 Other specified health status: Secondary | ICD-10-CM | POA: Diagnosis not present

## 2023-08-02 DIAGNOSIS — R7303 Prediabetes: Secondary | ICD-10-CM | POA: Diagnosis not present

## 2023-08-06 DIAGNOSIS — D649 Anemia, unspecified: Secondary | ICD-10-CM | POA: Diagnosis not present

## 2023-08-06 DIAGNOSIS — R5383 Other fatigue: Secondary | ICD-10-CM | POA: Diagnosis not present

## 2023-08-09 DIAGNOSIS — G4700 Insomnia, unspecified: Secondary | ICD-10-CM | POA: Diagnosis not present

## 2023-08-09 DIAGNOSIS — F331 Major depressive disorder, recurrent, moderate: Secondary | ICD-10-CM | POA: Diagnosis not present

## 2023-08-09 DIAGNOSIS — F411 Generalized anxiety disorder: Secondary | ICD-10-CM | POA: Diagnosis not present

## 2023-08-09 DIAGNOSIS — N951 Menopausal and female climacteric states: Secondary | ICD-10-CM | POA: Diagnosis not present

## 2023-08-10 DIAGNOSIS — I8393 Asymptomatic varicose veins of bilateral lower extremities: Secondary | ICD-10-CM | POA: Diagnosis not present

## 2023-08-10 DIAGNOSIS — G2581 Restless legs syndrome: Secondary | ICD-10-CM | POA: Diagnosis not present

## 2023-08-21 DIAGNOSIS — L680 Hirsutism: Secondary | ICD-10-CM | POA: Diagnosis not present

## 2023-08-21 DIAGNOSIS — L720 Epidermal cyst: Secondary | ICD-10-CM | POA: Diagnosis not present

## 2023-08-21 DIAGNOSIS — D2261 Melanocytic nevi of right upper limb, including shoulder: Secondary | ICD-10-CM | POA: Diagnosis not present

## 2023-09-21 ENCOUNTER — Other Ambulatory Visit: Payer: Self-pay

## 2023-09-21 DIAGNOSIS — I872 Venous insufficiency (chronic) (peripheral): Secondary | ICD-10-CM

## 2023-10-02 ENCOUNTER — Ambulatory Visit (HOSPITAL_COMMUNITY)
Admission: RE | Admit: 2023-10-02 | Discharge: 2023-10-02 | Disposition: A | Discharge status: Home / Self Care | Payer: BC Managed Care – PPO | Source: Ambulatory Visit | Attending: Vascular Surgery | Admitting: Vascular Surgery

## 2023-10-02 ENCOUNTER — Ambulatory Visit (INDEPENDENT_AMBULATORY_CARE_PROVIDER_SITE_OTHER): Payer: BC Managed Care – PPO | Admitting: Physician Assistant

## 2023-10-02 VITALS — BP 121/85 | HR 71 | Temp 98.2°F | Resp 20 | Ht 65.0 in | Wt 158.0 lb

## 2023-10-02 DIAGNOSIS — I872 Venous insufficiency (chronic) (peripheral): Secondary | ICD-10-CM

## 2023-10-02 DIAGNOSIS — I8392 Asymptomatic varicose veins of left lower extremity: Secondary | ICD-10-CM

## 2023-10-02 NOTE — Progress Notes (Signed)
 Office Note     CC:  follow up Requesting Provider:  Waylan Almarie SAUNDERS, MD  HPI: Mariah Irwin is a 53 y.o. (1971/05/22) female who presents for evaluation of left lower extremity asymptomatic varicose veins.  Her main concern is of restless legs in both legs that she experiences mainly at night.  She has been started on Requip however this is only mildly effective.  She has varicose veins especially in the left leg however they are largely asymptomatic.  She denies history of DVT, venous ulcerations, trauma, or prior vascular intervention.  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  She does not wear compression or make an effort to elevate her legs during the day.  She denies tobacco use.  Within the last year she had a right knee replacement however does not complain of much swelling on the right leg.   Past Medical History:  Diagnosis Date   Arthritis    generalized   Hypercholesteremia    on meds   Hypertension    on meds    Past Surgical History:  Procedure Laterality Date   CESAREAN SECTION     ENDOMETRIAL ABLATION     MENISCUS REPAIR Right    x 2   REFRACTIVE SURGERY     WISDOM TOOTH EXTRACTION      Social History   Socioeconomic History   Marital status: Married    Spouse name: Not on file   Number of children: Not on file   Years of education: Not on file   Highest education level: Not on file  Occupational History   Not on file  Tobacco Use   Smoking status: Never   Smokeless tobacco: Never  Vaping Use   Vaping status: Never Used  Substance and Sexual Activity   Alcohol use: Not Currently   Drug use: No   Sexual activity: Not on file  Other Topics Concern   Not on file  Social History Narrative   ** Merged History Encounter **       Social Drivers of Health   Financial Resource Strain: Not on file  Food Insecurity: Not on file  Transportation Needs: Not on file  Physical Activity: Not on file  Stress:  Not on file  Social Connections: Not on file  Intimate Partner Violence: Not on file    Family History  Problem Relation Age of Onset   High Cholesterol Father    Breast cancer Neg Hx     Current Outpatient Medications  Medication Sig Dispense Refill   Biotin 2.5 MG CAPS biotin 2,500 mcg capsule  Take 2 capsules every day by oral route.     cholecalciferol (VITAMIN D) 1000 units tablet Take 1,000 Units by mouth daily.     hydrochlorothiazide  (HYDRODIURIL ) 25 MG tablet Take 1 tablet by mouth  daily 90 tablet 3   ibuprofen (ADVIL) 800 MG tablet daily as needed.     Lactobacillus (ACIDOPHILUS PO) Take 1 tablet by mouth daily.     Magnesium 250 MG TABS Take 1 tablet by mouth daily.     meloxicam (MOBIC) 15 MG tablet Take 15 mg by mouth daily as needed.     Misc Natural Products (OSTEO BI-FLEX TRIPLE STRENGTH PO) Take 2 tablets by mouth daily.     Multiple Vitamin (MULTIVITAMIN) tablet Take 1 tablet by mouth daily.     Omega-3 Fatty Acids (FISH OIL) 1200 MG CAPS Take 2 capsules by mouth daily.  potassium chloride  SA (K-DUR,KLOR-CON ) 20 MEQ tablet Take 1 tablet by mouth two  times daily 180 tablet 3   rosuvastatin (CRESTOR) 5 MG tablet Take 1 tablet by mouth daily.     spironolactone (ALDACTONE) 25 MG tablet Take 25 mg by mouth daily.     vitamin E 400 UNIT capsule Take 400 Units by mouth daily.     vortioxetine HBr (TRINTELLIX) 5 MG TABS tablet Take 5 mg by mouth daily.     No current facility-administered medications for this visit.    Allergies  Allergen Reactions   Lisinopril Other (See Comments)    Dizziness/Stomach ache     REVIEW OF SYSTEMS:   [X]  denotes positive finding, [ ]  denotes negative finding Cardiac  Comments:  Chest pain or chest pressure:    Shortness of breath upon exertion:    Short of breath when lying flat:    Irregular heart rhythm:        Vascular    Pain in calf, thigh, or hip brought on by ambulation:    Pain in feet at night that wakes you  up from your sleep:     Blood clot in your veins:    Leg swelling:         Pulmonary    Oxygen at home:    Productive cough:     Wheezing:         Neurologic    Sudden weakness in arms or legs:     Sudden numbness in arms or legs:     Sudden onset of difficulty speaking or slurred speech:    Temporary loss of vision in one eye:     Problems with dizziness:         Gastrointestinal    Blood in stool:     Vomited blood:         Genitourinary    Burning when urinating:     Blood in urine:        Psychiatric    Major depression:         Hematologic    Bleeding problems:    Problems with blood clotting too easily:        Skin    Rashes or ulcers:        Constitutional    Fever or chills:      PHYSICAL EXAMINATION:  Vitals:   10/02/23 1316  BP: 121/85  Pulse: 71  Resp: 20  Temp: 98.2 F (36.8 C)  SpO2: 97%  Weight: 158 lb (71.7 kg)  Height: 5' 5 (1.651 m)    General:  WDWN in NAD; vital signs documented above Gait: Not observed HENT: WNL, normocephalic Pulmonary: normal non-labored breathing , without Rales, rhonchi,  wheezing Cardiac: regular HR Abdomen: soft, NT, no masses Skin: without rashes Vascular Exam/Pulses: Palpable DP pulses Extremities: Ropey varicosity left thigh and lateral knee; spider veins scattered both lower legs; no significant edema on exam; no venous ulcerations or pigmentation changes Musculoskeletal: no muscle wasting or atrophy  Neurologic: A&O X 3 Psychiatric:  The pt has Normal affect.   Non-Invasive Vascular Imaging:   Left lower extremity venous reflux study negative for DVT Incompetent common femoral vein GSV incompetent at the saphenofemoral junction and in the proximal calf Insufficiency also noted in the small saphenous vein    ASSESSMENT/PLAN:: 53 y.o. female here with chief complaint of restless legs of bilateral lower extremities  Mariah Irwin is a 53 year old female who complains mainly of restless legs of both  lower legs especially at night.  She has been started on Requip which seems to help some however is only mildly effective.  She has varicose veins in her left thigh and lateral knee however they are asymptomatic.  She does not complain of any swelling and does not have any skin changes related to venous insufficiency such as hyperpigmentation, induration, or ulcerations.  Left lower extremity reflux study was negative for DVT.  She does have mild deep and superficial venous insufficiency however would not be a candidate for saphenous vein ablation.  I am unaware of any relation with venous insufficiency and restless legs.  She will need to follow-up with her PCP for further management of restless legs.  Recommendations for slowing progression of venous insufficiency include daily use of knee-high compression, periodic elevation when possible during the day, avoiding prolonged sitting and standing.  For now she will follow-up on an as-needed basis.   Donnice Sender, PA-C [facility] [phone]  Clinic MD:   [place]

## 2023-10-04 DIAGNOSIS — Z0000 Encounter for general adult medical examination without abnormal findings: Secondary | ICD-10-CM | POA: Diagnosis not present

## 2023-10-04 DIAGNOSIS — Z13220 Encounter for screening for lipoid disorders: Secondary | ICD-10-CM | POA: Diagnosis not present

## 2023-10-12 ENCOUNTER — Other Ambulatory Visit: Payer: Self-pay | Admitting: Physician Assistant

## 2023-10-12 DIAGNOSIS — R7989 Other specified abnormal findings of blood chemistry: Secondary | ICD-10-CM | POA: Diagnosis not present

## 2023-10-12 DIAGNOSIS — Z0000 Encounter for general adult medical examination without abnormal findings: Secondary | ICD-10-CM | POA: Diagnosis not present

## 2023-10-12 DIAGNOSIS — E782 Mixed hyperlipidemia: Secondary | ICD-10-CM | POA: Diagnosis not present

## 2023-10-12 DIAGNOSIS — Z1231 Encounter for screening mammogram for malignant neoplasm of breast: Secondary | ICD-10-CM

## 2023-10-26 DIAGNOSIS — I10 Essential (primary) hypertension: Secondary | ICD-10-CM | POA: Diagnosis not present

## 2023-11-01 ENCOUNTER — Other Ambulatory Visit: Payer: Self-pay | Admitting: Nurse Practitioner

## 2023-11-01 DIAGNOSIS — E782 Mixed hyperlipidemia: Secondary | ICD-10-CM

## 2023-11-01 DIAGNOSIS — R7303 Prediabetes: Secondary | ICD-10-CM | POA: Diagnosis not present

## 2023-11-01 DIAGNOSIS — Z789 Other specified health status: Secondary | ICD-10-CM | POA: Diagnosis not present

## 2023-11-01 DIAGNOSIS — L680 Hirsutism: Secondary | ICD-10-CM | POA: Diagnosis not present

## 2023-11-27 ENCOUNTER — Ambulatory Visit: Payer: BC Managed Care – PPO

## 2023-11-29 ENCOUNTER — Ambulatory Visit
Admission: RE | Admit: 2023-11-29 | Discharge: 2023-11-29 | Disposition: A | Discharge status: Home / Self Care | Payer: BC Managed Care – PPO | Source: Ambulatory Visit | Attending: Physician Assistant | Admitting: Physician Assistant

## 2023-11-29 ENCOUNTER — Ambulatory Visit
Admission: RE | Admit: 2023-11-29 | Discharge: 2023-11-29 | Disposition: A | Discharge status: Home / Self Care | Payer: BC Managed Care – PPO | Source: Ambulatory Visit | Attending: Nurse Practitioner | Admitting: Nurse Practitioner

## 2023-11-29 DIAGNOSIS — E782 Mixed hyperlipidemia: Secondary | ICD-10-CM

## 2023-11-29 DIAGNOSIS — Z1231 Encounter for screening mammogram for malignant neoplasm of breast: Secondary | ICD-10-CM

## 2023-12-03 DIAGNOSIS — R7989 Other specified abnormal findings of blood chemistry: Secondary | ICD-10-CM | POA: Diagnosis not present

## 2023-12-03 DIAGNOSIS — Z789 Other specified health status: Secondary | ICD-10-CM | POA: Diagnosis not present

## 2023-12-03 DIAGNOSIS — E782 Mixed hyperlipidemia: Secondary | ICD-10-CM | POA: Diagnosis not present

## 2023-12-03 DIAGNOSIS — L680 Hirsutism: Secondary | ICD-10-CM | POA: Diagnosis not present

## 2023-12-17 DIAGNOSIS — E559 Vitamin D deficiency, unspecified: Secondary | ICD-10-CM | POA: Diagnosis not present

## 2023-12-17 DIAGNOSIS — R7303 Prediabetes: Secondary | ICD-10-CM | POA: Diagnosis not present

## 2023-12-17 DIAGNOSIS — L680 Hirsutism: Secondary | ICD-10-CM | POA: Diagnosis not present

## 2023-12-17 DIAGNOSIS — R7989 Other specified abnormal findings of blood chemistry: Secondary | ICD-10-CM | POA: Diagnosis not present

## 2023-12-17 DIAGNOSIS — E782 Mixed hyperlipidemia: Secondary | ICD-10-CM | POA: Diagnosis not present

## 2023-12-25 DIAGNOSIS — R7303 Prediabetes: Secondary | ICD-10-CM | POA: Diagnosis not present

## 2023-12-25 DIAGNOSIS — Z789 Other specified health status: Secondary | ICD-10-CM | POA: Diagnosis not present

## 2023-12-25 DIAGNOSIS — E559 Vitamin D deficiency, unspecified: Secondary | ICD-10-CM | POA: Diagnosis not present

## 2023-12-25 DIAGNOSIS — E782 Mixed hyperlipidemia: Secondary | ICD-10-CM | POA: Diagnosis not present

## 2024-01-07 DIAGNOSIS — G2581 Restless legs syndrome: Secondary | ICD-10-CM | POA: Diagnosis not present

## 2024-01-07 DIAGNOSIS — R7303 Prediabetes: Secondary | ICD-10-CM | POA: Diagnosis not present

## 2024-01-07 DIAGNOSIS — E559 Vitamin D deficiency, unspecified: Secondary | ICD-10-CM | POA: Diagnosis not present

## 2024-01-07 DIAGNOSIS — E782 Mixed hyperlipidemia: Secondary | ICD-10-CM | POA: Diagnosis not present

## 2024-02-29 DIAGNOSIS — R232 Flushing: Secondary | ICD-10-CM | POA: Diagnosis not present

## 2024-02-29 DIAGNOSIS — E7800 Pure hypercholesterolemia, unspecified: Secondary | ICD-10-CM | POA: Diagnosis not present

## 2024-04-16 DIAGNOSIS — R899 Unspecified abnormal finding in specimens from other organs, systems and tissues: Secondary | ICD-10-CM | POA: Diagnosis not present

## 2024-05-01 DIAGNOSIS — E785 Hyperlipidemia, unspecified: Secondary | ICD-10-CM | POA: Diagnosis not present

## 2024-05-07 DIAGNOSIS — Z789 Other specified health status: Secondary | ICD-10-CM | POA: Diagnosis not present

## 2024-05-07 DIAGNOSIS — E782 Mixed hyperlipidemia: Secondary | ICD-10-CM | POA: Diagnosis not present

## 2024-05-07 DIAGNOSIS — Z79899 Other long term (current) drug therapy: Secondary | ICD-10-CM | POA: Diagnosis not present

## 2024-05-07 DIAGNOSIS — G2581 Restless legs syndrome: Secondary | ICD-10-CM | POA: Diagnosis not present

## 2024-05-27 DIAGNOSIS — R21 Rash and other nonspecific skin eruption: Secondary | ICD-10-CM | POA: Diagnosis not present

## 2024-05-27 DIAGNOSIS — Z7185 Encounter for immunization safety counseling: Secondary | ICD-10-CM | POA: Diagnosis not present

## 2024-05-27 DIAGNOSIS — Z23 Encounter for immunization: Secondary | ICD-10-CM | POA: Diagnosis not present

## 2024-06-02 DIAGNOSIS — F411 Generalized anxiety disorder: Secondary | ICD-10-CM | POA: Diagnosis not present

## 2024-06-02 DIAGNOSIS — N951 Menopausal and female climacteric states: Secondary | ICD-10-CM | POA: Diagnosis not present

## 2024-06-02 DIAGNOSIS — G4700 Insomnia, unspecified: Secondary | ICD-10-CM | POA: Diagnosis not present

## 2024-06-02 DIAGNOSIS — G2581 Restless legs syndrome: Secondary | ICD-10-CM | POA: Diagnosis not present

## 2024-06-05 DIAGNOSIS — N951 Menopausal and female climacteric states: Secondary | ICD-10-CM | POA: Diagnosis not present

## 2024-06-11 DIAGNOSIS — M25561 Pain in right knee: Secondary | ICD-10-CM | POA: Diagnosis not present

## 2024-06-11 DIAGNOSIS — Z96651 Presence of right artificial knee joint: Secondary | ICD-10-CM | POA: Diagnosis not present

## 2024-07-22 DIAGNOSIS — N951 Menopausal and female climacteric states: Secondary | ICD-10-CM | POA: Diagnosis not present
